# Patient Record
Sex: Female | Born: 1985 | Race: Black or African American | Hispanic: No | Smoking: Former smoker
Health system: Southern US, Community
[De-identification: ages and names within clinical notes are randomized; demographics above are authoritative.]

## PROBLEM LIST (undated history)

## (undated) ENCOUNTER — Inpatient Hospital Stay (HOSPITAL_COMMUNITY): Payer: Self-pay

## (undated) DIAGNOSIS — R51 Headache: Secondary | ICD-10-CM

## (undated) DIAGNOSIS — K219 Gastro-esophageal reflux disease without esophagitis: Secondary | ICD-10-CM

## (undated) DIAGNOSIS — Z9889 Other specified postprocedural states: Secondary | ICD-10-CM

## (undated) DIAGNOSIS — R12 Heartburn: Secondary | ICD-10-CM

## (undated) DIAGNOSIS — D573 Sickle-cell trait: Secondary | ICD-10-CM

## (undated) DIAGNOSIS — O139 Gestational [pregnancy-induced] hypertension without significant proteinuria, unspecified trimester: Secondary | ICD-10-CM

## (undated) DIAGNOSIS — O26899 Other specified pregnancy related conditions, unspecified trimester: Secondary | ICD-10-CM

## (undated) DIAGNOSIS — R112 Nausea with vomiting, unspecified: Secondary | ICD-10-CM

## (undated) DIAGNOSIS — B029 Zoster without complications: Secondary | ICD-10-CM

## (undated) DIAGNOSIS — T7840XA Allergy, unspecified, initial encounter: Secondary | ICD-10-CM

## (undated) HISTORY — DX: Allergy, unspecified, initial encounter: T78.40XA

## (undated) HISTORY — DX: Zoster without complications: B02.9

## (undated) HISTORY — PX: LAPAROSCOPY: SHX197

---

## 2005-10-28 ENCOUNTER — Inpatient Hospital Stay (HOSPITAL_COMMUNITY): Admission: AD | Admit: 2005-10-28 | Discharge: 2005-10-28 | Payer: Self-pay | Admitting: *Deleted

## 2005-11-05 ENCOUNTER — Inpatient Hospital Stay (HOSPITAL_COMMUNITY): Admission: AD | Admit: 2005-11-05 | Discharge: 2005-11-05 | Payer: Self-pay | Admitting: Family Medicine

## 2006-05-20 ENCOUNTER — Ambulatory Visit (HOSPITAL_COMMUNITY): Admission: RE | Admit: 2006-05-20 | Discharge: 2006-05-20 | Payer: Self-pay | Admitting: Obstetrics and Gynecology

## 2006-11-03 ENCOUNTER — Emergency Department (HOSPITAL_COMMUNITY): Admission: EM | Admit: 2006-11-03 | Discharge: 2006-11-04 | Payer: Self-pay | Admitting: Emergency Medicine

## 2006-12-16 ENCOUNTER — Ambulatory Visit: Payer: Self-pay | Admitting: Family Medicine

## 2006-12-16 ENCOUNTER — Inpatient Hospital Stay (HOSPITAL_COMMUNITY): Admission: AD | Admit: 2006-12-16 | Discharge: 2006-12-17 | Payer: Self-pay | Admitting: Sports Medicine

## 2007-04-03 ENCOUNTER — Ambulatory Visit (HOSPITAL_COMMUNITY): Admission: RE | Admit: 2007-04-03 | Discharge: 2007-04-03 | Payer: Self-pay | Admitting: Obstetrics and Gynecology

## 2007-07-17 ENCOUNTER — Emergency Department (HOSPITAL_COMMUNITY): Admission: EM | Admit: 2007-07-17 | Discharge: 2007-07-17 | Payer: Self-pay | Admitting: Emergency Medicine

## 2007-08-04 ENCOUNTER — Emergency Department (HOSPITAL_COMMUNITY): Admission: EM | Admit: 2007-08-04 | Discharge: 2007-08-05 | Payer: Self-pay | Admitting: Emergency Medicine

## 2008-09-10 ENCOUNTER — Emergency Department (HOSPITAL_COMMUNITY): Admission: EM | Admit: 2008-09-10 | Discharge: 2008-09-10 | Payer: Self-pay | Admitting: Emergency Medicine

## 2009-02-08 ENCOUNTER — Emergency Department (HOSPITAL_COMMUNITY): Admission: EM | Admit: 2009-02-08 | Discharge: 2009-02-08 | Payer: Self-pay | Admitting: Emergency Medicine

## 2009-05-04 ENCOUNTER — Ambulatory Visit (HOSPITAL_COMMUNITY): Admission: RE | Admit: 2009-05-04 | Discharge: 2009-05-04 | Payer: Self-pay | Admitting: Obstetrics & Gynecology

## 2009-09-11 ENCOUNTER — Inpatient Hospital Stay (HOSPITAL_COMMUNITY): Admission: AD | Admit: 2009-09-11 | Discharge: 2009-09-11 | Payer: Self-pay | Admitting: Obstetrics & Gynecology

## 2009-11-15 ENCOUNTER — Ambulatory Visit (HOSPITAL_COMMUNITY): Admission: RE | Admit: 2009-11-15 | Discharge: 2009-11-15 | Payer: Self-pay | Admitting: Obstetrics

## 2009-12-06 ENCOUNTER — Ambulatory Visit (HOSPITAL_COMMUNITY): Admission: RE | Admit: 2009-12-06 | Discharge: 2009-12-06 | Payer: Self-pay | Admitting: Obstetrics & Gynecology

## 2009-12-27 ENCOUNTER — Ambulatory Visit (HOSPITAL_COMMUNITY): Admission: RE | Admit: 2009-12-27 | Discharge: 2009-12-27 | Payer: Self-pay | Admitting: Obstetrics & Gynecology

## 2010-02-02 ENCOUNTER — Ambulatory Visit: Payer: Self-pay | Admitting: Nurse Practitioner

## 2010-02-02 ENCOUNTER — Inpatient Hospital Stay (HOSPITAL_COMMUNITY): Admission: AD | Admit: 2010-02-02 | Discharge: 2010-02-02 | Payer: Self-pay | Admitting: Obstetrics

## 2010-02-17 ENCOUNTER — Ambulatory Visit (HOSPITAL_COMMUNITY): Admission: RE | Admit: 2010-02-17 | Discharge: 2010-02-17 | Payer: Self-pay | Admitting: Obstetrics & Gynecology

## 2010-03-15 ENCOUNTER — Encounter (INDEPENDENT_AMBULATORY_CARE_PROVIDER_SITE_OTHER): Payer: Self-pay | Admitting: *Deleted

## 2010-05-12 ENCOUNTER — Ambulatory Visit (HOSPITAL_COMMUNITY): Admission: RE | Admit: 2010-05-12 | Discharge: 2010-05-12 | Payer: Self-pay | Admitting: Obstetrics & Gynecology

## 2010-05-15 ENCOUNTER — Inpatient Hospital Stay (HOSPITAL_COMMUNITY): Admission: AD | Admit: 2010-05-15 | Discharge: 2010-05-20 | Payer: Self-pay | Admitting: Obstetrics & Gynecology

## 2010-05-17 ENCOUNTER — Encounter: Payer: Self-pay | Admitting: Obstetrics & Gynecology

## 2010-08-27 HISTORY — PX: WISDOM TOOTH EXTRACTION: SHX21

## 2010-09-13 IMAGING — US US OB DETAIL+14 WK
1 series · 14 of 28 positions shown · non-contrast
Comparison: none

OBSTETRICAL ULTRASOUND:
 This ultrasound was performed in The [HOSPITAL], and the [facility] report will be stored to [REDACTED] PACS.  This report is also available in [HOSPITAL]?s accessANYware.

[Series 1: us ob detail+14 wk · 14 of 101 slices shown]
[im 4/101]
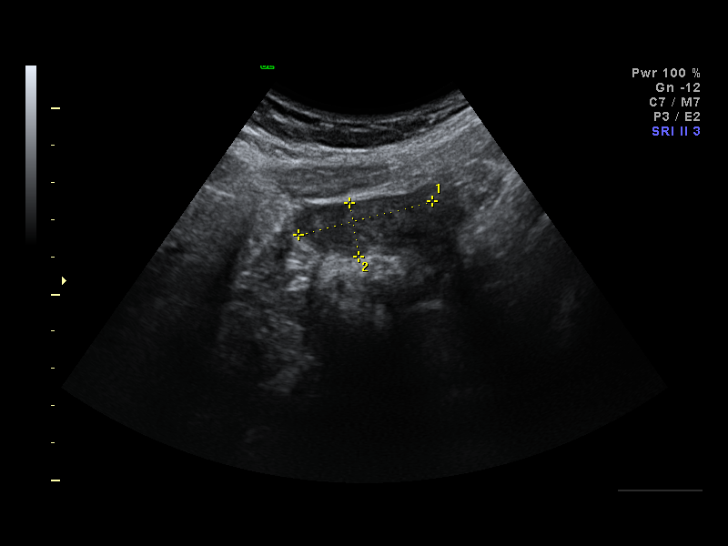
[im 12/101]
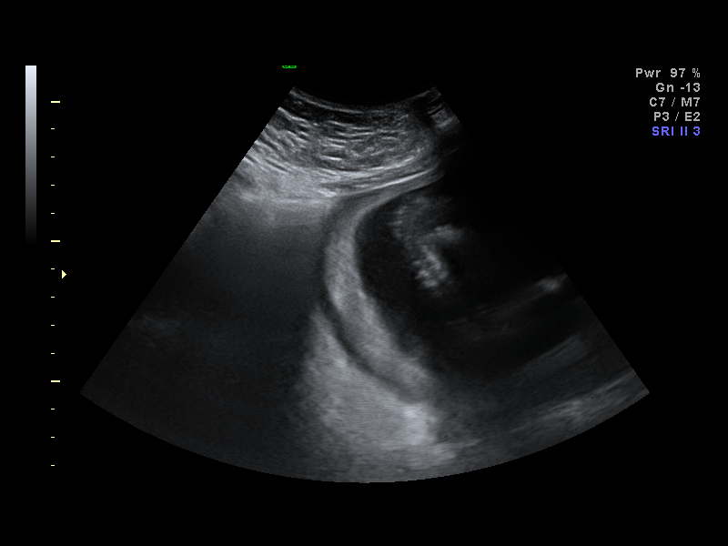
[im 19/101]
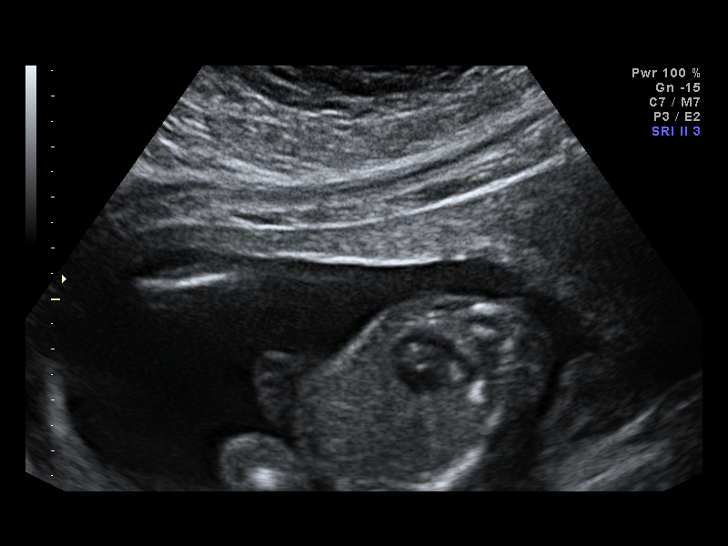
[im 26/101]
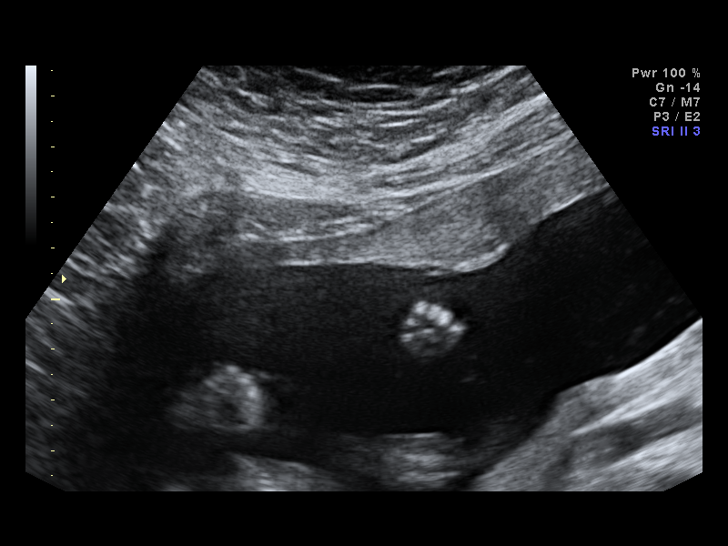
[im 34/101]
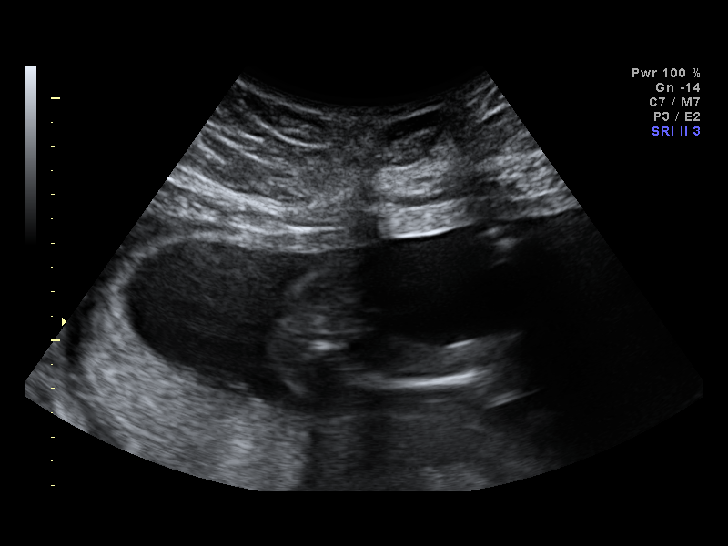
[im 41/101]
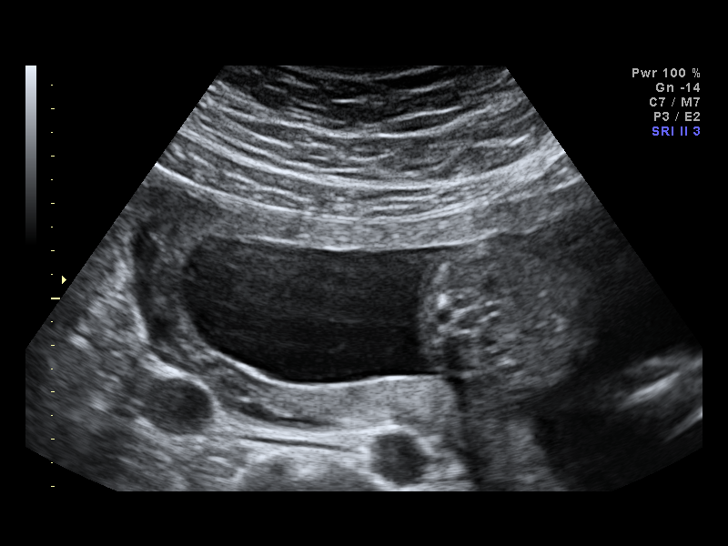
[im 49/101]
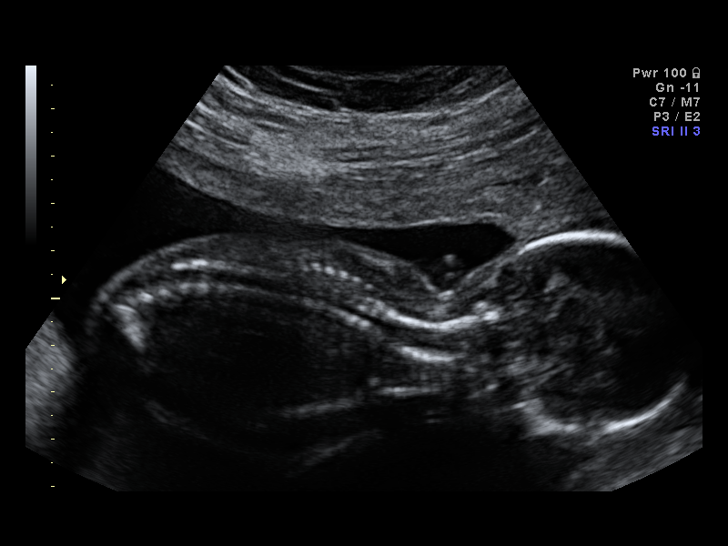
[im 56/101]
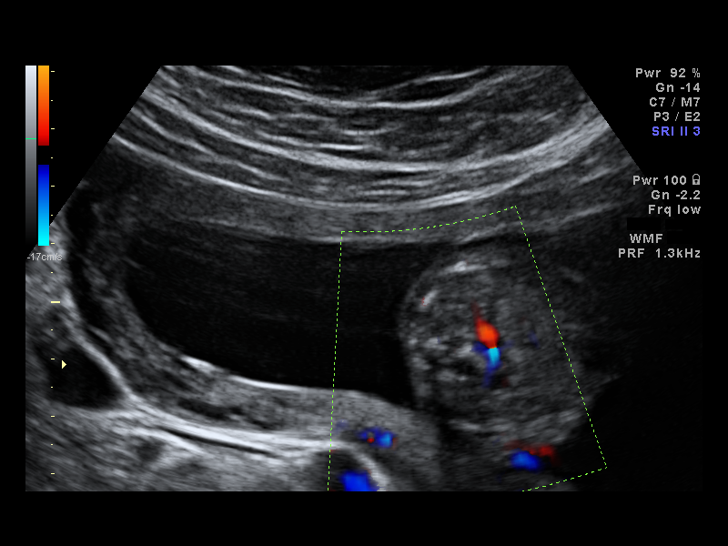
[im 63/101]
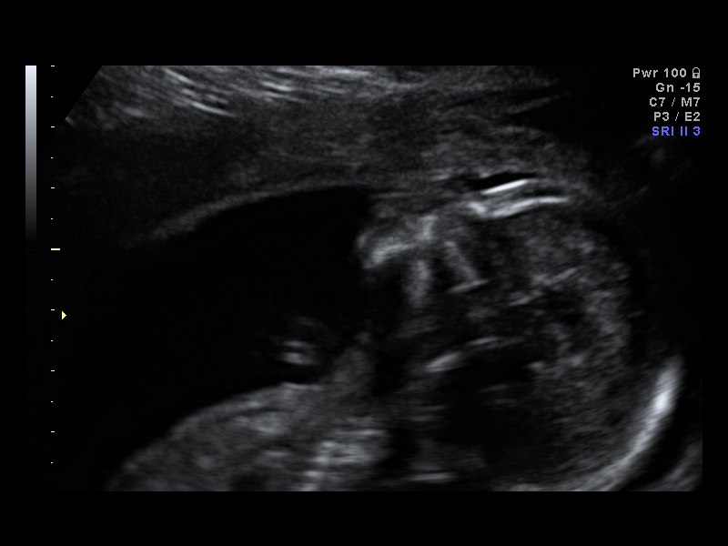
[im 71/101]
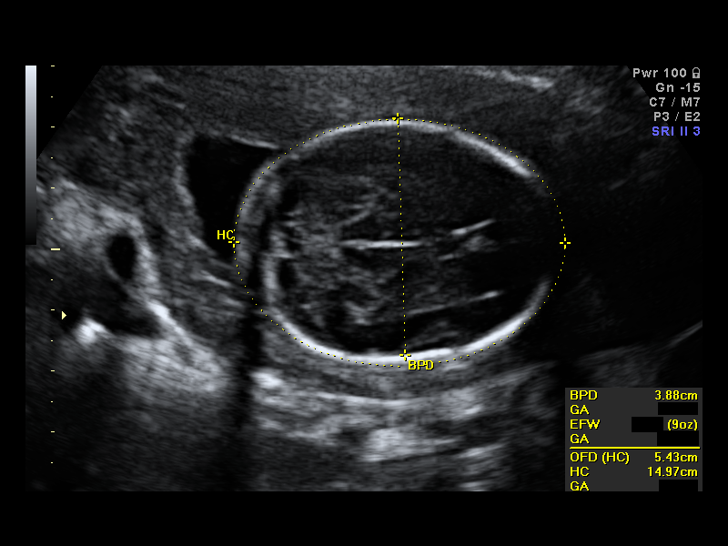
[im 78/101]
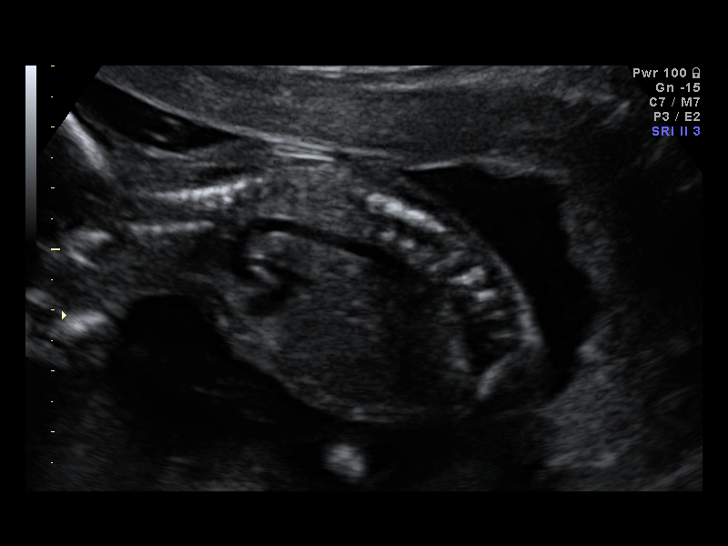
[im 86/101]
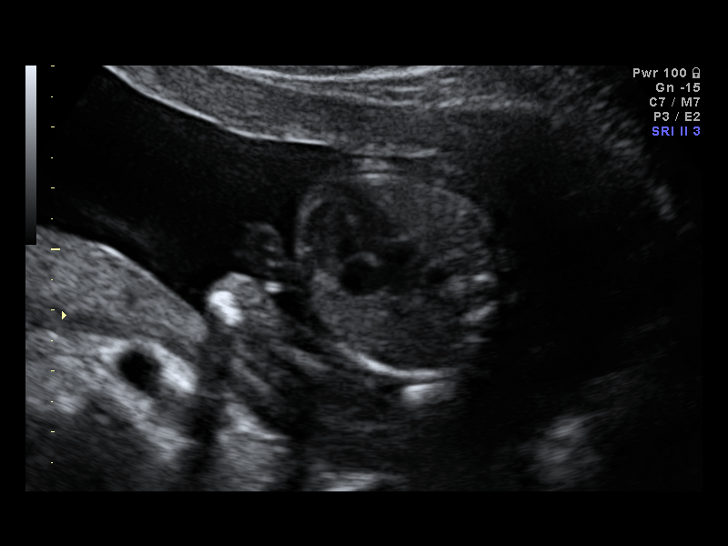
[im 93/101]
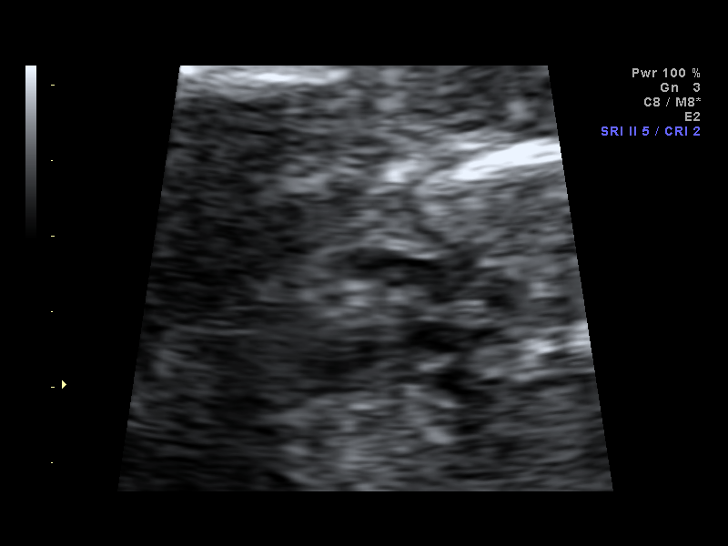
[im 101/101]
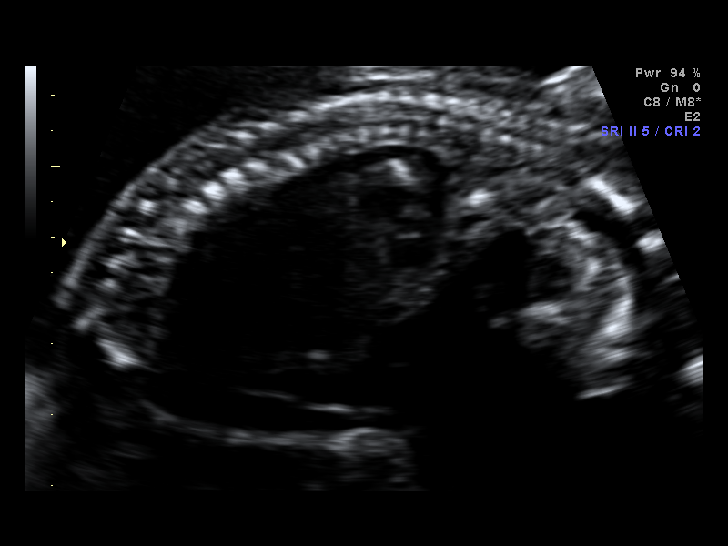

[14 of 28 positions shown; findings below may reference images not displayed]

IMPRESSION: [facility] has also been faxed to the ordering physician.

## 2010-09-26 NOTE — Letter (Signed)
Summary: New Patient letter  [facility]  [address] [place], [place] [address]   Phone: [phone]  Fax: [phone]       03/15/2010 MRN: [id number]  Tammy Jordan [address] [place], [place]  [address]  Dear Tammy Jordan,  Welcome to the Gastroenterology Division at [facility].    You are scheduled to see Dr.  Christella Hartigan on 05-03-10 at 10;00a.m.  on the 3rd floor at [facility], [address]  We ask that you try to arrive at our office 15 minutes prior to your appointment time to allow for check-in.  We would like you to complete the enclosed self-administered evaluation form prior to your visit and bring it with you on the day of your appointment.  We will review it with you.  Also, please bring a complete list of all your medications or, if you prefer, bring the medication bottles and we will list them.  Please bring your insurance card so that we may make a copy of it.  If your insurance requires a referral to see a specialist, please bring your referral form from your primary care physician.  Co-payments are due at the time of your visit and may be paid by cash, check or credit card.     Your office visit will consist of a consult with your physician (includes a physical exam), any laboratory testing he/she may order, scheduling of any necessary diagnostic testing (e.g. x-ray, ultrasound, CT-scan), and scheduling of a procedure (e.g. Endoscopy, Colonoscopy) if required.  Please allow enough time on your schedule to allow for any/all of these possibilities.    If you cannot keep your appointment, please call [phone] to cancel or reschedule prior to your appointment date.  This allows [place] the opportunity to schedule an appointment for another patient in need of care.  If you do not cancel or reschedule by 5 p.m. the business day prior to your appointment date, you will be charged a $50.00 late cancellation/no-show fee.    Thank you for choosing  [facility] for your medical needs.  We appreciate the opportunity to care for you.  Please visit [place] at our website  to learn more about our practice.                     Sincerely,                                                             The Gastroenterology Division

## 2010-11-09 LAB — DIFFERENTIAL
Eosinophils Absolute: 0 10*3/uL (ref 0.0–0.7)
Eosinophils Relative: 0 % (ref 0–5)
Neutro Abs: 9.4 10*3/uL — ABNORMAL HIGH (ref 1.7–7.7)
Neutrophils Relative %: 81 % — ABNORMAL HIGH (ref 43–77)

## 2010-11-09 LAB — LACTATE DEHYDROGENASE: LDH: 179 U/L (ref 94–250)

## 2010-11-09 LAB — COMPREHENSIVE METABOLIC PANEL
AST: 17 U/L (ref 0–37)
Creatinine, Ser: 0.83 mg/dL (ref 0.4–1.2)
GFR calc Af Amer: >60 mL/min (ref >60)
GFR calc non Af Amer: >60 mL/min (ref >60)
Glucose, Bld: 93 mg/dL (ref 70–99)
Total Protein: 6.6 g/dL (ref 6.0–8.3)

## 2010-11-09 LAB — CBC
HCT: 29.1 % — ABNORMAL LOW (ref 36.0–46.0)
HCT: 32.9 % — ABNORMAL LOW (ref 36.0–46.0)
Hemoglobin: 9.7 g/dL — ABNORMAL LOW (ref 12.0–15.0)
MCH: 27.3 pg (ref 26.0–34.0)
MCHC: 33.3 g/dL (ref 30.0–36.0)
MCV: 79.9 fL (ref 78.0–100.0)
MCV: 81.4 fL (ref 78.0–100.0)
Platelets: 211 10*3/uL (ref 150–400)
Platelets: 214 10*3/uL (ref 150–400)
RBC: 3.36 MIL/uL — ABNORMAL LOW (ref 3.87–5.11)
RDW: 14.7 % (ref 11.5–15.5)
WBC: 11.6 10*3/uL — ABNORMAL HIGH (ref 4.0–10.5)
WBC: 9.5 10*3/uL (ref 4.0–10.5)

## 2010-11-09 LAB — RPR: RPR Ser Ql: NONREACTIVE

## 2010-11-09 LAB — URIC ACID: Uric Acid, Serum: 5.9 mg/dL (ref 2.4–7.0)

## 2010-11-12 LAB — URINALYSIS, ROUTINE W REFLEX MICROSCOPIC
Ketones, ur: NEGATIVE mg/dL
Nitrite: NEGATIVE
Specific Gravity, Urine: 1.01 (ref 1.005–1.030)

## 2010-11-13 LAB — URINALYSIS, ROUTINE W REFLEX MICROSCOPIC
Glucose, UA: NEGATIVE mg/dL
Ketones, ur: NEGATIVE mg/dL
Nitrite: NEGATIVE
Protein, ur: NEGATIVE mg/dL
Urobilinogen, UA: 0.2 mg/dL (ref 0.0–1.0)
pH: 5.5 (ref 5.0–8.0)

## 2010-12-04 LAB — POCT I-STAT, CHEM 8
Calcium, Ion: 1.15 mmol/L (ref 1.12–1.32)
Chloride: 103 mEq/L (ref 96–112)
HCT: 41 % (ref 36.0–46.0)
Potassium: 4.3 mEq/L (ref 3.5–5.1)
Sodium: 138 mEq/L (ref 135–145)

## 2010-12-04 LAB — POCT PREGNANCY, URINE: Preg Test, Ur: NEGATIVE

## 2010-12-11 LAB — POCT PREGNANCY, URINE: Preg Test, Ur: NEGATIVE

## 2011-01-09 NOTE — Op Note (Signed)
NAMEDarnelle Jordan                 ACCOUNT NO.:  [id number]   MEDICAL RECORD NO.:  [id number]           PATIENT TYPE:   LOCATION:                                FACILITY:  [facility]   PHYSICIAN:  Lenoard Aden, M.D.DATE OF BIRTH:  11/03/1996   DATE OF PROCEDURE:  04/03/2007  DATE OF DISCHARGE:                               OPERATIVE REPORT   PREOPERATIVE DIAGNOSIS:  Recurrent pelvic pain with history of  endometriosis.   POSTOPERATIVE DIAGNOSIS:  Recurrent pelvic endometriosis.   PROCEDURE:  1. Diagnostic laparoscopy.  2. Fulguration of endometriosis.   SURGEON:  Lenoard Aden, M.D.   ANESTHESIA:  General.   ESTIMATED BLOOD LOSS:  Less than 50 mL.   COMPLICATIONS:  None.   DRAINS:  None.   COUNTS:  Correct.   DISPOSITION:  Patient to Recovery in good condition.   BRIEF OPERATIVE NOTE:  After being apprised of risks of anesthesia,  infection, bleeding, injury to abdominal organs with need for repair,  delayed risks or immediate complications to include bowel and bladder  injury, or inability to cure pelvic pain, the patient was brought to the  operating where she was administered a general anesthetic without  complications and prepped and draped in the usual sterile fashion,  catheterized and Foley catheter placed and Rubens cannula placed per  vagina.  Infraumbilical incision was made with a scalpel and Veress  needle placed, opening pressure of -2 noted, 3 L of CO2 insufflated  without difficulty, trocar placed, atraumatic trocar placement noted,  pictures taken, normal liver and gallbladder bed, normal appendiceal  area, normal-size uterus.  Anterior cul-de-sac is clear.  Findings are  as follows:  There is diffuse pelvic endometriosis along the right and  left uterosacral ligaments, along the posterior cul-de-sac of Douglas  and superficial implants along the right ovary.  Attempts to use YAG  laser are unsuccessful due to malfunction of the device; therefore,  Kleppinger bipolar cautery is placed through the 5-mm port  suprapubically without difficulty and fulguration of multiple implants  are done without difficulty.  Ureters are identified bilaterally and are  not part of the process.  Uterosacral ligaments are bilaterally  fulgurated, exposing multiple endometrial implants which are fulgurated  without difficulty.  Right ovary is fulgurated on the surface using  electrocautery without difficulty.  Good hemostasis is noted.  Bilateral  normal  tubes are noted.  At this time, procedure is terminated.  All  instruments are removed under direct visualization.  CO2 is released.  Incisions are closed using 0 Vicryl, 4-0 Vicryl and Dermabond.  Instruments are removed from the vagina.  The patient tolerates the  procedure well and is transferred to Recovery in good condition.      Lenoard Aden, M.D.  Electronically Signed     RJT/MEDQ  D:  04/03/2007  T:  04/04/2007  Job:  [id number]

## 2011-01-12 NOTE — H&P (Signed)
NAME:  Tammy Jordan, Tammy Jordan NO.:  [id number]   MEDICAL RECORD NO.:  [id number]          PATIENT TYPE:  OBV   LOCATION:  5727                         FACILITY:  [facility]   PHYSICIAN:  Melina Fiddler, MD DATE OF BIRTH:  16-Jun-1986   DATE OF ADMISSION:  12/15/2006  DATE OF DISCHARGE:                              HISTORY & PHYSICAL   PRIMARY CARE PHYSICIAN:  Dr. Merla Riches at [facility].   CHIEF COMPLAINT:  Vomiting and low back pain.   HISTORY OF PRESENT ILLNESS:  The patient is a very pleasant 25 year old  African American female who developed dysuria, frequency, and urgency on  Thursday.  By Friday night and early Saturday morning, it had progressed  to fever, nausea, and vomiting along with right flank pain.  She saw Dr.  Merla Riches at [facility] on April 19.  At that date, a  urinalysis shows positive nitrites, moderate leukocytes, 2+ rods, too  numerous to count white blood cells, negative epithelial cells and CVA  tenderness.  A white blood cell count obtained that day was 9.8.  She  was given IV fluids, Phenergan and ceftriaxone.   On return to [facility] today, white blood cell count is 8.1.  Urinalysis shows negative nitrite, small leukocyte esterase, white blood  cells 7-30 and trace bacteria.  However, her symptoms were not improved.  The patient was still complaining of fever, right CVA tenderness, nausea  and vomiting.  Dr. Merla Riches was particularly concerned about the level  of CVA tenderness that the patient had.  He thought that her pain was  out of proportion to the laboratory findings and was concerned about  possibly a perinephric or renal abscess that was resistant to  antibiotics.   The patient currently denies any vaginal discharge, diarrhea, abdominal  pain, pelvic pain or any other symptoms.   PAST MEDICAL HISTORY:  1. Endometriosis.  2. History of a laparoscopy by Dr. Billy Coast which diagnosed her  endometriosis.   PAST SURGICAL HISTORY:  She has had an exploratory laparotomy by Dr.  Billy Coast.   MEDICATIONS:  1. Depo-Provera every 3 months.  2. P.r.n. OxyContin for her pelvic pain.   ALLERGIES:  To SEAFOOD and BEE STINGS.   SOCIAL HISTORY:  The patient is single.  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  She admits to occasional alcohol,  remote tobacco, no recreational drugs.  She has a stable monogamous  sexual relationship with 1 partner.  She states that she uses condoms  every time in addition to Depo-Provera.  Furthermore, she says she has  no concern about possible STD exposure with this partner and she denies  any vaginal symptoms at the present time.   FAMILY HISTORY:  Mom has a history of throat cancer and hypertension.  Father has a history of diabetes mellitus and hypertension.  Maternal  grandmother had female cancer.   REVIEW OF SYSTEMS:  CONSTITUTIONAL:  Positive for fever, chills and  myalgia.  HEENT:  No rhinorrhea, no sore throat, no otalgia, no blurred  vision.  PULMONARY:  No cough, no shortness of breath, no dyspnea on  exertion, no pleurisy.  CARDIOVASCULAR:  No chest pain, but is positive  for tachycardia.  ABDOMEN:  She reports right flank pain that radiates  to the right groin, positive nausea, positive vomiting, no diarrhea, no  bright red blood per rectum and no melena and no symptoms of a bowel  obstruction.   PHYSICAL EXAM:  VITALS:  Pending at the time of this dictation.  GENERAL:  In no apparent distress, alert and oriented x3.  The patient  is nontoxic-appearing and sleeping comfortably in bed.  You can tell  that the patient does not see well; however, she does not have a septic  appearance.  HEENT:  Atraumatic, normocephalic.  Pupils are equal, round and reactive  to light.  Extraocular movements are intact.  TMs and canals clear on  examination.  There is no erythema or exudate in the posterior  oropharynx.  Mucous  membranes are moist.  NECK:  There is no lymphadenopathy in the neck, No JVD, no bruits and no  thyromegaly.  CARDIOVASCULAR:  Tachycardic at 106 beats per minute with no murmurs,  rubs or gallops.  PULMONARY:  Clear to auscultation bilaterally.  No wheezes, crackles or  rales.  ABDOMEN:  Soft, nontender and nondistended.  Positive bowel sounds.  There is no guarding, no rebound, no peritoneal signs.  There is no  pelvic tenderness, although she does have significant right CVA  tenderness.  EXTREMITIES:  No cyanosis, clubbing, edema or rashes.  NEUROLOGIC:  Cranial nerves II-XII are grossly intact with muscle  strength 5/5, equal and symmetric in the upper and lower extremities.   ASSESSMENT AND PLAN:  [age]-year-old African American female with  presumed pyelonephritis.  1. Pyelonephritis:  I agree with Dr. Merla Riches that the patient needs      to be observed overnight here in the hospital.  We will continue      the Rocephin 1 g intravenously q.24 h., as based on urinalysis at      [facility] it looks like it is an Escherichia coli      and pyelonephritis.  We will give her as-needed Phenergan and      morphine.  We will give her intravenous fluids and advance diet as      tolerated.  We will send blood cultures, urine cultures, urinalysis      and a urine Gram's stain.  We will check a urine pregnancy test.      At the present time, there appears to be no indication for      gonorrhea or Chlamydia testing.  I discussed options with the      patient and her family.  For the present time, we will observe      clinically overnight, as she has only been on antibiotics for less      than 24 hours.  Per my reading an up-to-date, it is usually only      considered antibiotic failure after 3 days if symptoms are      worsening, for which we should scan for the perinephric abscess.     However, if her condition or symptoms worsen, would consider the CT      to look for  the perinephric abscess.  Based on literature, there      appears to be no true cross-reactivity with radiocontrast media and      shellfish iodine; it is usually just an increased  risk of allergic      reaction in atopic individuals.  However, given her relatively      stable clinical appearance, I do not feel it is urgent to do      tonight and therefore I will not risk the possible allergic      interaction unless necessary.  2. Fluids, electrolytes and nutrition:  We will check a CMP and CBC.      Intravenous fluids of normal saline 1-L      bolus then 150 mL an hour thereafter and advance diet as tolerated.      We will follow up her electrolytes and replete any electrolyte      deficiencies, given her recent nausea and vomiting.  We will follow      up her laboratory studies and monitor clinically over the next 24      hours.      Broadus John T. Pamalee Leyden, MD    ______________________________  Melina Fiddler, MD    WTP/MEDQ  D:  12/15/2006  T:  12/16/2006  Job:  [id number]   cc:   Attention:  Dr. Sheryle Spray [facility]

## 2011-01-12 NOTE — Op Note (Signed)
NAME:  Tammy Jordan, Tammy Jordan                 ACCOUNT NO.:  [id number]   MEDICAL RECORD NO.:  [id number]          PATIENT TYPE:  AMB   LOCATION:  SDC                           FACILITY:  [facility]   PHYSICIAN:  Lenoard Aden, M.D.DATE OF BIRTH:  10-01-1985   DATE OF PROCEDURE:  05/21/2006  DATE OF DISCHARGE:                                 OPERATIVE REPORT   PREOPERATIVE DIAGNOSIS:  Pelvic pain.   POSTOPERATIVE DIAGNOSIS:  Pelvic endometriosis.   OPERATION/PROCEDURE:  1. Diagnostic laparoscopy.  2. Ablation of pelvic endometriosis.   SURGEON:  Lenoard Aden, M.D.   ANESTHESIA:  General.   ESTIMATED BLOOD LOSS:  Less than 50 mL.   COMPLICATIONS:  None.   DRAINS:  None.   COUNTS:  Correct.   DISPOSITION:  The patient to the recovery room in good condition.   DESCRIPTION OF PROCEDURE:  After being apprised of the risks of anesthesia,  infection, bleeding, injury to abdominal organs and need for repair, delayed  versus immediate complications to include bowel and bladder injury noted,  and inability to cure pelvic pain, she was brought to the operating room  where she was administered a general anesthesia anesthetic without  complications, prepped and draped in the usual sterile fashion.  Foley  catheter placed.  After achieving adequate anesthesia, a Hulka tenaculum was  placed per vagina.  Infraumbilical incision was made with the scalpel.  Veress needle placed.  Opening pressure minus 2 noted and  3 L CO2  insufflated without difficulty.  Trocar placed for visualization and  laparoscope placed and visualization reveals atraumatic trocar entry.  Normal liver and gallbladder area.  Normal appendiceal area.  Normal size  uterus.  Bilateral normal tubes and ovaries.  There is diffuse punctate  vesicular appearing endometriosis along the right uterosacral, left  uterosacral and enteric cul-de-sac.  There do appear to be some mild  implants along the right ovary.  Kleppinger bipolar  cautery is entered  through a 5 mm suprapubic port under direct visualization, and all of these  diffuse lesions, most of which are surface cauterized using the Kleppinger  bipolar cautery and anterior posterior cul-de-sac, right uterosacral  ligament and there is a Master's window just beneath the left uterosacral  ligament which is ablated.  Having achieved good hemostasis and ablation of  visible endometriosis, except for that which lies in the right ovarian fossa  over the right  ureter, all instruments were removed under direct visualization.  CO2 is  released.  Incision closed with using 0 Vicryl and Dermabond. Foley catheter  and Hulka tenaculum removed from the vagina. Marcaine solution previously  placed.  The patient is awakened and  transferred to recovery in good  condition.      Lenoard Aden, M.D.  Electronically Signed     RJT/MEDQ  D:  05/20/2006  T:  05/22/2006  Job:  [id number]

## 2011-01-12 NOTE — Discharge Summary (Signed)
NAME:  Tammy Jordan, Tammy Jordan NO.:  [id number]   MEDICAL RECORD NO.:  [id number]          PATIENT TYPE:  INP   LOCATION:  5727                         FACILITY:  [facility]   PHYSICIAN:  Melina Fiddler, MD DATE OF BIRTH:  [birth year]   DATE OF ADMISSION:  12/15/2006  DATE OF DISCHARGE:  12/17/2006                               DISCHARGE SUMMARY   Primary care physician is Dr. Merla Riches at [facility].   DISCHARGE DIAGNOSES:  1. Pyelonephritis.  2. Nausea and vomiting.  3. Fever.   DISCHARGE MEDICATIONS:  1. Bactrim Double Strength two tablets p.o. b.i.d. x14 days.  2. Depo-Provera as previously instructed.   PERTINENT LABORATORY DATA:  Initial urinalysis on admission was negative  for nitrite but showed moderate leukocyte esterase; however, the patient  had already had antibiotics prior to this.  Gram stain was negative for  bacteria.  Urinalysis and Gram stain from [facility] were  significantly indicative of infection with gram-negative rods present on  Gram stain.  Urine pregnancy test was negative on admission.  CBC on  admission revealed a white blood cell count of 10.6, hemoglobin of 11.5,  hematocrit of 33.4 and platelets of 217.  Complete metabolic panel  revealed a potassium of 3.4 and an albumin 3.2 but was otherwise  unremarkable.  Blood cultures obtained on admission showed no growth to  date at the time of discharge.  Urine culture obtained on admission here  was no growth final.  Urine culture results sent by [facility]  prior to initiation of antibiotics are pending at the time of discharge  at [facility].  Attempt was made to obtain these results, which did reveal  greater than 100,000 colonies of E. coli; however, sensitivities were  still pending at the time of discharge.  Basic metabolic panel at the  time of discharge revealed normal electrolytes.  CBC at the time of  discharge revealed a white blood cell count of 6.7,  hemoglobin 11.7,  hematocrit 35.0 and platelets of 258.   CONSULTATION:  None.   PROCEDURES:  None.   BRIEF HOSPITAL COURSE:  Please see full dictated history and physical  for full details of initial presentation and workup.  In brief this is a  25 year old African American female admitted with pyelonephritis.  The  patient was sent from Urgent Care after initiation of IV Rocephin given  her persistent fevers and CVA tenderness with concern for possible  development of perinephric abscess.   Problem #1.  PYELONEPHRITIS:  The patient continued to spike nightly  fevers while in the hospital of 101.6 to 101.7.  However, her clinical  picture showed progressive improvement daily while in the hospital.  Her  right CVA tenderness progressively decreased to minimal by the time of  discharge.  The patient had no abdominal pain, and nausea and vomiting  were completely resolved by the time of discharge.  The patient was  tolerating a full diet.  Multiple attempts were made to obtain urine  culture results from [facility] and [facility] prior to discharge.  [facility] was  able to give a verbal result over the phone of greater than  100,000 colonies of E. coli; however, sensitivities were unavailable at  the time of discharge.  Given the patient's clinical improvement and  desire to be discharged, the patient was discharged home on empiric  therapy of p.o. Bactrim after receiving two doses of IV Rocephin.  The  patient was advised to return to Urgent Care within the next 1-2 days  after discharge to confirm that her urine culture positive for E. coli  showed sensitivity to Bactrim.  The patient was also advised to return  for any worsening fevers, increase in the CVA tenderness or abdominal  pain or inability to tolerate p.o. antibiotics.  Given the patient's  progressive clinical improvement throughout admission, decreasing of  fevers and decreasing of pain, no further imaging or concern  a  perinephric abscess was present.   Problem 2.  FLUIDS, ELECTROLYTES, AND NUTRITION/GASTROINTESTINAL:  The  patient had persistent nausea and vomiting on admission; however, this  quickly resolved and the patient was able to tolerate a full diet  without antiemetics prior to discharge.   DISCHARGE INSTRUCTIONS:  1. The patient is to increase activity slowly, has no dietary      restrictions.  2. The patient is to follow up with Urgent Care in 1-2 days to ensure      that her urine culture results show sensitivity to Bactrim, the      antibiotic she was discharged on.  3. The patient is to return to the emergency room should she have any      increasing in fevers, abdominal pain or CVA tenderness or inability      to tolerate oral antibiotics.   FOLLOW-UP APPOINTMENTS:  The patient is to follow up with Dr. Merla Riches  at [facility] during his walk-in hours 1-2 days after discharge.  A phone number was provided.   Pending results and issues to be followed at the time of discharge:  Urine culture sensitivities from [facility] sent on December 14, 2006.   The patient was discharged home with her mother in stable condition.     ______________________________  Drue Dun, M.D.    ______________________________  Melina Fiddler, MD    EE/MEDQ  D:  12/19/2006  T:  12/19/2006  Job:  [id number]   cc:   Dr. Sheryle Spray [facility]

## 2011-06-11 LAB — CBC
Hemoglobin: 12.4
RDW: 13.2
WBC: 5.5

## 2011-08-28 DIAGNOSIS — K219 Gastro-esophageal reflux disease without esophagitis: Secondary | ICD-10-CM

## 2011-08-28 HISTORY — DX: Gastro-esophageal reflux disease without esophagitis: K21.9

## 2011-09-11 LAB — OB RESULTS CONSOLE ABO/RH: RH Type: POSITIVE

## 2011-09-11 LAB — OB RESULTS CONSOLE HEPATITIS B SURFACE ANTIGEN: Hepatitis B Surface Ag: NEGATIVE

## 2011-09-11 LAB — OB RESULTS CONSOLE GC/CHLAMYDIA: Chlamydia: NEGATIVE

## 2011-09-11 LAB — OB RESULTS CONSOLE RPR: RPR: NONREACTIVE

## 2011-10-24 ENCOUNTER — Inpatient Hospital Stay (HOSPITAL_COMMUNITY)
Admission: AD | Admit: 2011-10-24 | Discharge: 2011-10-24 | Disposition: A | Discharge status: Home / Self Care | Payer: Medicaid Other | Source: Ambulatory Visit | Attending: Obstetrics & Gynecology | Admitting: Obstetrics & Gynecology

## 2011-10-24 ENCOUNTER — Encounter (HOSPITAL_COMMUNITY): Payer: Self-pay

## 2011-10-24 DIAGNOSIS — R109 Unspecified abdominal pain: Secondary | ICD-10-CM | POA: Insufficient documentation

## 2011-10-24 DIAGNOSIS — O99891 Other specified diseases and conditions complicating pregnancy: Secondary | ICD-10-CM | POA: Insufficient documentation

## 2011-10-24 DIAGNOSIS — O219 Vomiting of pregnancy, unspecified: Secondary | ICD-10-CM

## 2011-10-24 DIAGNOSIS — O26899 Other specified pregnancy related conditions, unspecified trimester: Secondary | ICD-10-CM

## 2011-10-24 DIAGNOSIS — O210 Mild hyperemesis gravidarum: Secondary | ICD-10-CM | POA: Insufficient documentation

## 2011-10-24 HISTORY — DX: Sickle-cell trait: D57.3

## 2011-10-24 HISTORY — DX: Gestational (pregnancy-induced) hypertension without significant proteinuria, unspecified trimester: O13.9

## 2011-10-24 LAB — COMPREHENSIVE METABOLIC PANEL
Alkaline Phosphatase: 64 U/L (ref 39–117)
BUN: 7 mg/dL (ref 6–23)
Calcium: 9.3 mg/dL (ref 8.4–10.5)
GFR calc Af Amer: >90 mL/min (ref >90)
Glucose, Bld: 89 mg/dL (ref 70–99)
Total Protein: 7 g/dL (ref 6.0–8.3)

## 2011-10-24 LAB — CBC
Hemoglobin: 11.7 g/dL — ABNORMAL LOW (ref 12.0–15.0)
MCH: 28 pg (ref 26.0–34.0)
MCV: 80.6 fL (ref 78.0–100.0)
RBC: 4.18 MIL/uL (ref 3.87–5.11)

## 2011-10-24 LAB — URINALYSIS, ROUTINE W REFLEX MICROSCOPIC
Glucose, UA: NEGATIVE mg/dL
Leukocytes, UA: NEGATIVE
Protein, ur: NEGATIVE mg/dL
Urobilinogen, UA: 0.2 mg/dL (ref 0.0–1.0)

## 2011-10-24 LAB — DIFFERENTIAL
Eosinophils Absolute: 0.1 10*3/uL (ref 0.0–0.7)
Eosinophils Relative: 1 % (ref 0–5)
Lymphs Abs: 1.9 10*3/uL (ref 0.7–4.0)
Monocytes Relative: 6 % (ref 3–12)

## 2011-10-24 LAB — WET PREP, GENITAL: Trich, Wet Prep: NONE SEEN

## 2011-10-24 MED ORDER — ACETAMINOPHEN 325 MG PO TABS
650.0000 mg | ORAL_TABLET | Freq: Once | ORAL | Status: AC
  Administered 2011-10-24: 650 mg via ORAL
  Filled 2011-10-24: qty 2

## 2011-10-24 MED ORDER — ONDANSETRON HCL 8 MG PO TABS: 8.0000 mg | ORAL_TABLET | Freq: Three times a day (TID) | ORAL | Status: AC | PRN

## 2011-10-24 MED ORDER — ONDANSETRON 8 MG PO TBDP
8.0000 mg | ORAL_TABLET | Freq: Once | ORAL | Status: AC
  Administered 2011-10-24: 8 mg via ORAL
  Filled 2011-10-24: qty 1

## 2011-10-24 NOTE — ED Provider Notes (Signed)
History     CSN: [id number]  Arrival date & time 10/24/11  1219   None     Chief Complaint  Patient presents with  . Abdominal Pain    HPI Tammy Jordan is a 26 y.o. female @ [redacted]w[redacted]d gestation who presents to [facility] for abdominal pain. Has had pressure off and on for  about a month  but was only mild and not painful. Today began having pain with nauea and vomiting. No diarrhea. The history was provided by the patient.   Past Medical History  Diagnosis Date  . Endometriosis   . Sickle cell trait   . Pregnancy induced hypertension     PIH with 1st baby    Past Surgical History  Procedure Date  . Cesarean section   . Laparoscopy   . Wisdom tooth extraction 2012    History reviewed. No pertinent family history.  History  Substance Use Topics  . Smoking status: Never Smoker   . Smokeless tobacco: Not on file  . Alcohol Use: No    OB History    Grav Para Term Preterm Abortions TAB SAB Ect Mult Living   4 1 1  2  2   1       Review of Systems  Constitutional: Positive for chills. Negative for fever, diaphoresis and fatigue.  HENT: Positive for congestion. Negative for ear pain, sore throat, facial swelling, neck pain, neck stiffness, dental problem and sinus pressure.   Eyes: Negative for photophobia, pain and discharge.  Respiratory: Negative for cough, chest tightness and wheezing.   Cardiovascular: Negative.   Gastrointestinal: Positive for nausea, vomiting and abdominal pain. Negative for diarrhea, constipation and abdominal distention.  Genitourinary: Positive for pelvic pain. Negative for dysuria, urgency, frequency, flank pain, vaginal bleeding, vaginal discharge and difficulty urinating.  Musculoskeletal: Positive for back pain. Negative for myalgias and gait problem.  Skin: Negative for color change and rash.  Neurological: Positive for dizziness and headaches. Negative for speech difficulty, weakness, light-headedness and numbness.  Psychiatric/Behavioral:  Negative for confusion and agitation. The patient is not nervous/anxious.     Allergies  Shellfish allergy  Home Medications  No current outpatient prescriptions on file.  BP 113/82  Pulse 94  Temp(Src) 97.9 F (36.6 C) (Oral)  Resp 16  Ht 5' 2.5" (1.588 m)  Wt 171 lb 12.8 oz (77.928 kg)  BMI 30.92 kg/m2  SpO2 100%  Physical Exam  Nursing note and vitals reviewed. Constitutional: She is oriented to person, place, and time. She appears well-developed and well-nourished.  HENT:  Head: Normocephalic.  Eyes: EOM are normal.  Neck: Neck supple.  Cardiovascular: Normal rate.   Pulmonary/Chest: Effort normal.  Abdominal: Soft. There is generalized tenderness. There is no rigidity, no rebound, no guarding and no CVA tenderness.  Genitourinary:       External genitalia without lesions. Cervix long, closed, uterus consistent with dates. Positive FHT.  Musculoskeletal: Normal range of motion.  Neurological: She is alert and oriented to person, place, and time. No cranial nerve deficit.  Skin: Skin is warm and dry.  Psychiatric: She has a normal mood and affect. Her behavior is normal. Judgment and thought content normal.   Results for orders placed during the hospital encounter of 10/24/11 (from the past 24 hour(s))  CBC     Status: Abnormal   Collection Time   10/24/11  2:45 PM      Component Value Range   WBC 9.1  4.0 - 10.5 (K/uL)   RBC  4.18  3.87 - 5.11 (MIL/uL)   Hemoglobin 11.7 (*) 12.0 - 15.0 (g/dL)   HCT 51.8 (*) 84.1 - 46.0 (%)   MCV 80.6  78.0 - 100.0 (fL)   MCH 28.0  26.0 - 34.0 (pg)   MCHC 34.7  30.0 - 36.0 (g/dL)   RDW 66.0  63.0 - 16.0 (%)   Platelets 255  150 - 400 (K/uL)  DIFFERENTIAL     Status: Normal   Collection Time   10/24/11  2:45 PM      Component Value Range   Neutrophils Relative 72  43 - 77 (%)   Neutro Abs 6.5  1.7 - 7.7 (K/uL)   Lymphocytes Relative 21  12 - 46 (%)   Lymphs Abs 1.9  0.7 - 4.0 (K/uL)   Monocytes Relative 6  3 - 12 (%)    Monocytes Absolute 0.6  0.1 - 1.0 (K/uL)   Eosinophils Relative 1  0 - 5 (%)   Eosinophils Absolute 0.1  0.0 - 0.7 (K/uL)   Basophils Relative 0  0 - 1 (%)   Basophils Absolute 0.0  0.0 - 0.1 (K/uL)  COMPREHENSIVE METABOLIC PANEL     Status: Abnormal   Collection Time   10/24/11  2:45 PM      Component Value Range   Sodium 133 (*) 135 - 145 (mEq/L)   Potassium 4.0  3.5 - 5.1 (mEq/L)   Chloride 99  96 - 112 (mEq/L)   CO2 25  19 - 32 (mEq/L)   Glucose, Bld 89  70 - 99 (mg/dL)   BUN 7  6 - 23 (mg/dL)   Creatinine, Ser 1.09  0.50 - 1.10 (mg/dL)   Calcium 9.3  8.4 - 32.3 (mg/dL)   Total Protein 7.0  6.0 - 8.3 (g/dL)   Albumin 3.2 (*) 3.5 - 5.2 (g/dL)   AST 11  0 - 37 (U/L)   ALT 9  0 - 35 (U/L)   Alkaline Phosphatase 64  39 - 117 (U/L)   Total Bilirubin 0.3  0.3 - 1.2 (mg/dL)   GFR calc non Af Amer >90  >90 (mL/min)   GFR calc Af Amer >90  >90 (mL/min)  WET PREP, GENITAL     Status: Abnormal   Collection Time   10/24/11  3:27 PM      Component Value Range   Yeast Wet Prep HPF POC NONE SEEN  NONE SEEN    Trich, Wet Prep NONE SEEN  NONE SEEN    Clue Cells Wet Prep HPF POC NONE SEEN  NONE SEEN    WBC, Wet Prep HPF POC MODERATE (*) NONE SEEN   URINALYSIS, ROUTINE W REFLEX MICROSCOPIC     Status: Abnormal   Collection Time   10/24/11  3:30 PM      Component Value Range   Color, Urine YELLOW  YELLOW    APPearance CLEAR  CLEAR    Specific Gravity, Urine 1.010  1.005 - 1.030    pH 6.0  5.0 - 8.0    Glucose, UA NEGATIVE  NEGATIVE (mg/dL)   Hgb urine dipstick NEGATIVE  NEGATIVE    Bilirubin Urine NEGATIVE  NEGATIVE    Ketones, ur 15 (*) NEGATIVE (mg/dL)   Protein, ur NEGATIVE  NEGATIVE (mg/dL)   Urobilinogen, UA 0.2  0.0 - 1.0 (mg/dL)   Nitrite NEGATIVE  NEGATIVE    Leukocytes, UA NEGATIVE  NEGATIVE    ED Course: Discussed with Dr. Tamela Oddi and will give patient zofran for nausea  and have her follow up in the office. If her pain increases she will return here.   Procedures     MDM: Patient has had no nausea or vomiting since arrival to [facility]   Assessment: Nausea and vomiting in pregnancy   Abdominal pain in pregnancy  Plan:  Zofran Rx   Tylenol prn   Clear liquids today then advance to B.R.A.T. Diet   Follow up in the office or return here if symptoms worsen      [facility], NP 10/24/11 1605

## 2011-10-24 NOTE — Progress Notes (Signed)
Patient states she has had painless lower abdominal pressure for about one month, is getting worse and is now painful. Has had multiple episodes of vomiting today, unable to keep anything down. No bleeding or discharge.

## 2011-10-24 NOTE — Discharge Instructions (Signed)
STAY ON CLEAR LIQUIDS TODAY AND THEN ADVANCE TO THE B.R.A.T. DIET. TAKE THE MEDICATION AS DIRECTED FOR NAUSEA. TAKE TYLENOL FOR PAIN.  I SPOKE WITH DR. Tamela Oddi AND SHE WANTS YOU TO FOLLOW UP IN THE OFFICE. IF YOUR PAIN INCREASES TONIGHT RETURN HERE.

## 2011-10-25 LAB — GC/CHLAMYDIA PROBE AMP, GENITAL: GC Probe Amp, Genital: NEGATIVE

## 2012-01-29 ENCOUNTER — Other Ambulatory Visit: Payer: Self-pay | Admitting: Obstetrics & Gynecology

## 2012-01-29 DIAGNOSIS — Z3689 Encounter for other specified antenatal screening: Secondary | ICD-10-CM

## 2012-02-04 ENCOUNTER — Ambulatory Visit (HOSPITAL_COMMUNITY)
Admission: RE | Admit: 2012-02-04 | Discharge: 2012-02-04 | Disposition: A | Discharge status: Home / Self Care | Payer: Medicaid Other | Source: Ambulatory Visit | Attending: Obstetrics & Gynecology | Admitting: Obstetrics & Gynecology

## 2012-02-04 DIAGNOSIS — O139 Gestational [pregnancy-induced] hypertension without significant proteinuria, unspecified trimester: Secondary | ICD-10-CM | POA: Insufficient documentation

## 2012-02-04 DIAGNOSIS — Z3689 Encounter for other specified antenatal screening: Secondary | ICD-10-CM

## 2012-02-04 DIAGNOSIS — O358XX0 Maternal care for other (suspected) fetal abnormality and damage, not applicable or unspecified: Secondary | ICD-10-CM | POA: Insufficient documentation

## 2012-02-04 DIAGNOSIS — Z363 Encounter for antenatal screening for malformations: Secondary | ICD-10-CM | POA: Insufficient documentation

## 2012-02-04 DIAGNOSIS — O09299 Supervision of pregnancy with other poor reproductive or obstetric history, unspecified trimester: Secondary | ICD-10-CM | POA: Insufficient documentation

## 2012-02-04 DIAGNOSIS — Z1389 Encounter for screening for other disorder: Secondary | ICD-10-CM | POA: Insufficient documentation

## 2012-02-04 DIAGNOSIS — O2620 Pregnancy care for patient with recurrent pregnancy loss, unspecified trimester: Secondary | ICD-10-CM | POA: Insufficient documentation

## 2012-03-04 ENCOUNTER — Other Ambulatory Visit: Payer: Self-pay | Admitting: Obstetrics & Gynecology

## 2012-03-14 ENCOUNTER — Encounter (HOSPITAL_COMMUNITY): Payer: Self-pay

## 2012-03-17 ENCOUNTER — Ambulatory Visit (HOSPITAL_COMMUNITY)
Admission: RE | Admit: 2012-03-17 | Discharge: 2012-03-17 | Disposition: A | Discharge status: Home / Self Care | Payer: Medicaid Other | Source: Ambulatory Visit | Attending: Nurse Practitioner | Admitting: Nurse Practitioner

## 2012-03-17 ENCOUNTER — Other Ambulatory Visit: Payer: Self-pay | Admitting: Nurse Practitioner

## 2012-03-17 DIAGNOSIS — D573 Sickle-cell trait: Secondary | ICD-10-CM | POA: Insufficient documentation

## 2012-03-17 DIAGNOSIS — O99019 Anemia complicating pregnancy, unspecified trimester: Secondary | ICD-10-CM | POA: Insufficient documentation

## 2012-03-17 DIAGNOSIS — O139 Gestational [pregnancy-induced] hypertension without significant proteinuria, unspecified trimester: Secondary | ICD-10-CM | POA: Insufficient documentation

## 2012-03-17 DIAGNOSIS — O368390 Maternal care for abnormalities of the fetal heart rate or rhythm, unspecified trimester, not applicable or unspecified: Secondary | ICD-10-CM | POA: Insufficient documentation

## 2012-03-17 DIAGNOSIS — O288 Other abnormal findings on antenatal screening of mother: Secondary | ICD-10-CM

## 2012-03-17 DIAGNOSIS — I10 Essential (primary) hypertension: Secondary | ICD-10-CM

## 2012-03-17 DIAGNOSIS — O09299 Supervision of pregnancy with other poor reproductive or obstetric history, unspecified trimester: Secondary | ICD-10-CM | POA: Insufficient documentation

## 2012-03-21 ENCOUNTER — Encounter (HOSPITAL_COMMUNITY): Payer: Self-pay

## 2012-03-21 ENCOUNTER — Encounter (HOSPITAL_COMMUNITY)
Admission: RE | Admit: 2012-03-21 | Discharge: 2012-03-21 | Disposition: A | Discharge status: Home / Self Care | Payer: Medicaid Other | Source: Ambulatory Visit | Attending: Obstetrics & Gynecology | Admitting: Obstetrics & Gynecology

## 2012-03-21 HISTORY — DX: Heartburn: R12

## 2012-03-21 HISTORY — DX: Other specified postprocedural states: Z98.890

## 2012-03-21 HISTORY — DX: Other specified pregnancy related conditions, unspecified trimester: O26.899

## 2012-03-21 HISTORY — DX: Nausea with vomiting, unspecified: R11.2

## 2012-03-21 HISTORY — DX: Headache: R51

## 2012-03-21 HISTORY — DX: Gastro-esophageal reflux disease without esophagitis: K21.9

## 2012-03-21 LAB — ABO/RH: ABO/RH(D): O POS

## 2012-03-21 LAB — CBC
MCH: 22.7 pg — ABNORMAL LOW (ref 26.0–34.0)
Platelets: 242 10*3/uL (ref 150–400)
RBC: 4.36 MIL/uL (ref 3.87–5.11)
RDW: 15.3 % (ref 11.5–15.5)
WBC: 9.7 10*3/uL (ref 4.0–10.5)

## 2012-03-21 LAB — TYPE AND SCREEN: ABO/RH(D): O POS

## 2012-03-21 LAB — SURGICAL PCR SCREEN: MRSA, PCR: NEGATIVE

## 2012-03-21 NOTE — Patient Instructions (Addendum)
Your procedure is scheduled on:03/28/12  Enter through the Main Entrance at : 1245pm Pick up desk phone and dial [phone] and inform us of your arrival.  Please call [phone] if you have any problems the morning of surgery.  Remember: Do not eat after 5:30am on Friday Do not drink after:10am on Friday (clear liquids) Milk ok until 8am on Friday  Take these meds the morning of surgery with a sip of water:Pantoprazole, Pepcid  DO NOT wear jewelry, eye make-up, lotion, or dark fingernail polish. Do not shave for 48 hours prior to surgery.  If you are to be admitted after surgery, leave suitcase in car until your room has been assigned. Patients discharged on the day of surgery will not be allowed to drive home.   Remember to use your Hibiclens as instructed.

## 2012-03-21 NOTE — Pre-Procedure Instructions (Signed)
Dr. Rodman Pickle approved pt to drink milk until 6 hrs preop, but wants pt to be aware that surgery would not be able to be moved up to an earlier time.

## 2012-03-22 LAB — RPR: RPR Ser Ql: NONREACTIVE

## 2012-03-27 NOTE — H&P (Signed)
Tammy Jordan is a 26 y.o. female presenting for an elective repeat C/D. Maternal Medical History:  Reason for admission: Presents for an elective repeat C/D.  Fetal activity: Perceived fetal activity is normal.    Prenatal complications: no prenatal complications   OB History    Grav Para Term Preterm Abortions TAB SAB Ect Mult Living   4 1 1  2  2   1      Past Medical History  Diagnosis Date  . Endometriosis   . Sickle cell trait   . Heartburn in pregnancy   . GERD (gastroesophageal reflux disease) 2013    severe (per pt) heartburn- on meds but milk works better  . PONV (postoperative nausea and vomiting)   . Headache     migraines  . Pregnancy induced hypertension     PIH with 1st baby   Past Surgical History  Procedure Date  . Cesarean section   . Laparoscopy   . Wisdom tooth extraction 2012   Family History: family history is not on file. Social History:  reports that she has never smoked. She does not have any smokeless tobacco history on file. She reports that she does not drink alcohol or use illicit drugs.   Prenatal Transfer Tool  Maternal Diabetes: No Maternal Ultrasounds/Referrals: Normal Fetal Ultrasounds or other Referrals:  None Maternal Substance Abuse:  No Significant Maternal Medications:  None Significant Maternal Lab Results:  None Other Comments:  None  Review of Systems  Constitutional: Negative for fever.  Eyes: Negative for blurred vision.  Respiratory: Negative for shortness of breath.   Gastrointestinal: Negative for vomiting.  Skin: Negative for rash.  Neurological: Negative for headaches.      Last menstrual period 06/27/2011. Maternal Exam:  Abdomen: Patient reports no abdominal tenderness. Fetal presentation: vertex  Introitus: not evaluated.   Cervix: not evaluated.   Fetal Exam Fetal Monitor Review: Mode: hand-held doppler probe.       Physical Exam  Constitutional: She appears well-developed.  HENT:  Head:  Normocephalic.  Neck: Neck supple. No thyromegaly present.  Cardiovascular: Normal rate and regular rhythm.   Respiratory: Breath sounds normal.  GI: Soft. Bowel sounds are normal.  Skin: No rash noted.    Prenatal labs: ABO, Rh: --/--/O POS (07/26 1230) Antibody: NEG (07/26 1225) Rubella:   RPR: NON REACTIVE (07/26 1225)  HBsAg: Negative (01/15 0000)  HIV: Non-reactive (01/15 0000)       Assessment/Plan: 26 y.o. P1 w/an IUP @ [redacted]w[redacted]d.  H/O previous C/D.  Declines TOLAC.  Admit Repeat C/D   JACKSON-MOORE,Marthann Abshier A 03/27/2012, 2:58 PM

## 2012-03-28 ENCOUNTER — Encounter (HOSPITAL_COMMUNITY)
Admission: RE | Disposition: A | Discharge status: Home / Self Care | Payer: Self-pay | Source: Ambulatory Visit | Attending: Obstetrics & Gynecology

## 2012-03-28 ENCOUNTER — Inpatient Hospital Stay (HOSPITAL_COMMUNITY): Payer: Medicaid Other

## 2012-03-28 ENCOUNTER — Encounter (HOSPITAL_COMMUNITY): Payer: Self-pay

## 2012-03-28 ENCOUNTER — Encounter (HOSPITAL_COMMUNITY): Payer: Self-pay | Admitting: Obstetrics & Gynecology

## 2012-03-28 ENCOUNTER — Inpatient Hospital Stay (HOSPITAL_COMMUNITY)
Admission: RE | Admit: 2012-03-28 | Discharge: 2012-03-31 | DRG: 766 | Disposition: A | Discharge status: Home / Self Care | Payer: Medicaid Other | Source: Ambulatory Visit | Attending: Obstetrics & Gynecology | Admitting: Obstetrics & Gynecology

## 2012-03-28 DIAGNOSIS — IMO0002 Reserved for concepts with insufficient information to code with codable children: Secondary | ICD-10-CM | POA: Diagnosis present

## 2012-03-28 DIAGNOSIS — O34219 Maternal care for unspecified type scar from previous cesarean delivery: Principal | ICD-10-CM | POA: Diagnosis present

## 2012-03-28 LAB — TYPE AND SCREEN: Antibody Screen: NEGATIVE

## 2012-03-28 SURGERY — Surgical Case
Anesthesia: Spinal | Site: Abdomen | Wound class: Clean Contaminated

## 2012-03-28 MED ORDER — MORPHINE SULFATE (PF) 0.5 MG/ML IJ SOLN
INTRAMUSCULAR | Status: DC | PRN
  Administered 2012-03-28: .1 mg via INTRATHECAL

## 2012-03-28 MED ORDER — NALBUPHINE HCL 10 MG/ML IJ SOLN
5.0000 mg | INTRAMUSCULAR | Status: DC | PRN
Start: 2012-03-28 — End: 2012-03-31
  Administered 2012-03-29: 10 mg via SUBCUTANEOUS
  Filled 2012-03-28 (×2): qty 1

## 2012-03-28 MED ORDER — DIPHENHYDRAMINE HCL 25 MG PO CAPS
25.0000 mg | ORAL_CAPSULE | ORAL | Status: DC | PRN
  Administered 2012-03-29 – 2012-03-30 (×2): 25 mg via ORAL
  Filled 2012-03-28 (×3): qty 1

## 2012-03-28 MED ORDER — LANOLIN HYDROUS EX OINT: 1.0000 "application " | TOPICAL_OINTMENT | CUTANEOUS | Status: DC | PRN

## 2012-03-28 MED ORDER — FENTANYL CITRATE 0.05 MG/ML IJ SOLN
25.0000 ug | INTRAMUSCULAR | Status: DC | PRN
  Administered 2012-03-28: 50 ug via INTRAVENOUS

## 2012-03-28 MED ORDER — FENTANYL CITRATE 0.05 MG/ML IJ SOLN
INTRAMUSCULAR | Status: DC | PRN
  Administered 2012-03-28: 25 ug via INTRATHECAL

## 2012-03-28 MED ORDER — OXYTOCIN 10 UNIT/ML IJ SOLN
40.0000 [IU] | INTRAVENOUS | Status: DC | PRN
  Administered 2012-03-28: 40 [IU] via INTRAVENOUS

## 2012-03-28 MED ORDER — OXYCODONE-ACETAMINOPHEN 5-325 MG PO TABS: 1.0000 | ORAL_TABLET | ORAL | Status: DC | PRN

## 2012-03-28 MED ORDER — FENTANYL CITRATE 0.05 MG/ML IJ SOLN
INTRAMUSCULAR | Status: AC
  Administered 2012-03-28: 50 ug via INTRAVENOUS
  Filled 2012-03-28: qty 2

## 2012-03-28 MED ORDER — MIDAZOLAM HCL 2 MG/2ML IJ SOLN: 0.5000 mg | Freq: Once | INTRAMUSCULAR | Status: DC | PRN

## 2012-03-28 MED ORDER — ACETAMINOPHEN 10 MG/ML IV SOLN: 1000.0000 mg | Freq: Four times a day (QID) | INTRAVENOUS | Status: AC | PRN

## 2012-03-28 MED ORDER — SENNOSIDES-DOCUSATE SODIUM 8.6-50 MG PO TABS
2.0000 | ORAL_TABLET | Freq: Every day | ORAL | Status: DC
  Administered 2012-03-29 – 2012-03-30 (×2): 2 via ORAL

## 2012-03-28 MED ORDER — SODIUM CHLORIDE 0.9 % IV SOLN: 1.0000 ug/kg/h | INTRAVENOUS | Status: DC | PRN

## 2012-03-28 MED ORDER — SIMETHICONE 80 MG PO CHEW: 80.0000 mg | CHEWABLE_TABLET | ORAL | Status: DC | PRN

## 2012-03-28 MED ORDER — METOCLOPRAMIDE HCL 5 MG/ML IJ SOLN
INTRAMUSCULAR | Status: AC
  Administered 2012-03-28: 10 mg via INTRAVENOUS
  Filled 2012-03-28: qty 2

## 2012-03-28 MED ORDER — MAGNESIUM HYDROXIDE 400 MG/5ML PO SUSP: 30.0000 mL | ORAL | Status: DC | PRN

## 2012-03-28 MED ORDER — OXYTOCIN 40 UNITS IN LACTATED RINGERS INFUSION - SIMPLE MED: 62.5000 mL/h | INTRAVENOUS | Status: AC

## 2012-03-28 MED ORDER — METOCLOPRAMIDE HCL 5 MG/ML IJ SOLN
10.0000 mg | Freq: Three times a day (TID) | INTRAMUSCULAR | Status: DC | PRN
  Administered 2012-03-28: 10 mg via INTRAVENOUS

## 2012-03-28 MED ORDER — NALBUPHINE HCL 10 MG/ML IJ SOLN
5.0000 mg | INTRAMUSCULAR | Status: DC | PRN
  Administered 2012-03-28 (×2): 10 mg via INTRAVENOUS

## 2012-03-28 MED ORDER — MORPHINE SULFATE 0.5 MG/ML IJ SOLN
INTRAMUSCULAR | Status: AC
  Filled 2012-03-28: qty 10

## 2012-03-28 MED ORDER — KETOROLAC TROMETHAMINE 30 MG/ML IJ SOLN
INTRAMUSCULAR | Status: AC
  Administered 2012-03-28: 30 mg via INTRAVENOUS
  Filled 2012-03-28: qty 1

## 2012-03-28 MED ORDER — FENTANYL CITRATE 0.05 MG/ML IJ SOLN
INTRAMUSCULAR | Status: AC
  Filled 2012-03-28: qty 2

## 2012-03-28 MED ORDER — MEPERIDINE HCL 25 MG/ML IJ SOLN: 6.2500 mg | INTRAMUSCULAR | Status: DC | PRN

## 2012-03-28 MED ORDER — FERROUS SULFATE 325 (65 FE) MG PO TABS
325.0000 mg | ORAL_TABLET | Freq: Two times a day (BID) | ORAL | Status: DC
  Administered 2012-03-29: 325 mg via ORAL
  Administered 2012-03-29: 650 mg via ORAL
  Administered 2012-03-30 – 2012-03-31 (×3): 325 mg via ORAL
  Filled 2012-03-28 (×5): qty 1

## 2012-03-28 MED ORDER — WITCH HAZEL-GLYCERIN EX PADS: 1.0000 "application " | MEDICATED_PAD | CUTANEOUS | Status: DC | PRN

## 2012-03-28 MED ORDER — DIPHENHYDRAMINE HCL 50 MG/ML IJ SOLN: 12.5000 mg | INTRAMUSCULAR | Status: DC | PRN

## 2012-03-28 MED ORDER — BUPIVACAINE IN DEXTROSE 0.75-8.25 % IT SOLN
INTRATHECAL | Status: DC | PRN
  Administered 2012-03-28: 1.5 mL via INTRATHECAL

## 2012-03-28 MED ORDER — PHENYLEPHRINE 40 MCG/ML (10ML) SYRINGE FOR IV PUSH (FOR BLOOD PRESSURE SUPPORT)
PREFILLED_SYRINGE | INTRAVENOUS | Status: AC
  Filled 2012-03-28: qty 5

## 2012-03-28 MED ORDER — PROMETHAZINE HCL 25 MG/ML IJ SOLN
6.2500 mg | INTRAMUSCULAR | Status: DC | PRN
  Administered 2012-03-28: 6.25 mg via INTRAVENOUS

## 2012-03-28 MED ORDER — IBUPROFEN 600 MG PO TABS
600.0000 mg | ORAL_TABLET | Freq: Four times a day (QID) | ORAL | Status: DC
  Administered 2012-03-29 – 2012-03-31 (×10): 600 mg via ORAL
  Filled 2012-03-28 (×10): qty 1

## 2012-03-28 MED ORDER — SODIUM CHLORIDE 0.9 % IJ SOLN: 3.0000 mL | INTRAMUSCULAR | Status: DC | PRN

## 2012-03-28 MED ORDER — ONDANSETRON HCL 4 MG/2ML IJ SOLN: 4.0000 mg | INTRAMUSCULAR | Status: DC | PRN

## 2012-03-28 MED ORDER — TETANUS-DIPHTH-ACELL PERTUSSIS 5-2.5-18.5 LF-MCG/0.5 IM SUSP: 0.5000 mL | Freq: Once | INTRAMUSCULAR | Status: DC

## 2012-03-28 MED ORDER — EPHEDRINE SULFATE 50 MG/ML IJ SOLN
INTRAMUSCULAR | Status: DC | PRN
  Administered 2012-03-28: 10 mg via INTRAVENOUS

## 2012-03-28 MED ORDER — NALBUPHINE SYRINGE 5 MG/0.5 ML
INJECTION | INTRAMUSCULAR | Status: AC
  Administered 2012-03-28: 10 mg via INTRAVENOUS
  Filled 2012-03-28: qty 1

## 2012-03-28 MED ORDER — DIPHENHYDRAMINE HCL 50 MG/ML IJ SOLN: 25.0000 mg | INTRAMUSCULAR | Status: DC | PRN

## 2012-03-28 MED ORDER — PROMETHAZINE HCL 25 MG/ML IJ SOLN
INTRAMUSCULAR | Status: AC
  Administered 2012-03-28: 6.25 mg via INTRAVENOUS
  Filled 2012-03-28: qty 1

## 2012-03-28 MED ORDER — CEFAZOLIN SODIUM-DEXTROSE 2-3 GM-% IV SOLR
2.0000 g | INTRAVENOUS | Status: AC
  Administered 2012-03-28: 2 g via INTRAVENOUS

## 2012-03-28 MED ORDER — ZOLPIDEM TARTRATE 5 MG PO TABS: 5.0000 mg | ORAL_TABLET | Freq: Every evening | ORAL | Status: DC | PRN

## 2012-03-28 MED ORDER — PRENATAL MULTIVITAMIN CH
1.0000 | ORAL_TABLET | Freq: Every day | ORAL | Status: DC
  Administered 2012-03-29 – 2012-03-31 (×3): 1 via ORAL
  Filled 2012-03-28 (×3): qty 1

## 2012-03-28 MED ORDER — ONDANSETRON HCL 4 MG/2ML IJ SOLN
INTRAMUSCULAR | Status: AC
  Filled 2012-03-28: qty 2

## 2012-03-28 MED ORDER — NALOXONE HCL 0.4 MG/ML IJ SOLN: 0.4000 mg | INTRAMUSCULAR | Status: DC | PRN

## 2012-03-28 MED ORDER — ONDANSETRON HCL 4 MG PO TABS: 4.0000 mg | ORAL_TABLET | ORAL | Status: DC | PRN

## 2012-03-28 MED ORDER — LACTATED RINGERS IV SOLN
INTRAVENOUS | Status: DC
  Administered 2012-03-29: 03:00:00 via INTRAVENOUS

## 2012-03-28 MED ORDER — LACTATED RINGERS IV SOLN
INTRAVENOUS | Status: DC
  Administered 2012-03-28 (×4): via INTRAVENOUS

## 2012-03-28 MED ORDER — KETOROLAC TROMETHAMINE 30 MG/ML IJ SOLN
30.0000 mg | Freq: Four times a day (QID) | INTRAMUSCULAR | Status: AC | PRN
  Administered 2012-03-28: 30 mg via INTRAVENOUS

## 2012-03-28 MED ORDER — MEASLES, MUMPS & RUBELLA VAC ~~LOC~~ INJ
0.5000 mL | INJECTION | Freq: Once | SUBCUTANEOUS | Status: DC
  Filled 2012-03-28: qty 0.5

## 2012-03-28 MED ORDER — SCOPOLAMINE 1 MG/3DAYS TD PT72
1.0000 | MEDICATED_PATCH | Freq: Once | TRANSDERMAL | Status: DC
  Administered 2012-03-28: 1.5 mg via TRANSDERMAL

## 2012-03-28 MED ORDER — CEFAZOLIN SODIUM-DEXTROSE 2-3 GM-% IV SOLR
INTRAVENOUS | Status: AC
  Filled 2012-03-28: qty 50

## 2012-03-28 MED ORDER — DIPHENHYDRAMINE HCL 25 MG PO CAPS: 25.0000 mg | ORAL_CAPSULE | Freq: Four times a day (QID) | ORAL | Status: DC | PRN

## 2012-03-28 MED ORDER — SCOPOLAMINE 1 MG/3DAYS TD PT72
MEDICATED_PATCH | TRANSDERMAL | Status: AC
  Administered 2012-03-28: 1.5 mg via TRANSDERMAL
  Filled 2012-03-28: qty 1

## 2012-03-28 MED ORDER — SCOPOLAMINE 1 MG/3DAYS TD PT72: 1.0000 | MEDICATED_PATCH | Freq: Once | TRANSDERMAL | Status: DC

## 2012-03-28 MED ORDER — ONDANSETRON HCL 4 MG/2ML IJ SOLN
INTRAMUSCULAR | Status: DC | PRN
  Administered 2012-03-28: 4 mg via INTRAVENOUS

## 2012-03-28 MED ORDER — OXYTOCIN 10 UNIT/ML IJ SOLN
INTRAMUSCULAR | Status: AC
  Filled 2012-03-28: qty 4

## 2012-03-28 MED ORDER — DIBUCAINE 1 % RE OINT: 1.0000 "application " | TOPICAL_OINTMENT | RECTAL | Status: DC | PRN

## 2012-03-28 MED ORDER — KETOROLAC TROMETHAMINE 30 MG/ML IJ SOLN: 30.0000 mg | Freq: Four times a day (QID) | INTRAMUSCULAR | Status: AC | PRN

## 2012-03-28 MED ORDER — MEDROXYPROGESTERONE ACETATE 150 MG/ML IM SUSP: 150.0000 mg | INTRAMUSCULAR | Status: DC | PRN

## 2012-03-28 MED ORDER — PHENYLEPHRINE HCL 10 MG/ML IJ SOLN
INTRAMUSCULAR | Status: DC | PRN
  Administered 2012-03-28 (×5): 120 ug via INTRAVENOUS

## 2012-03-28 MED ORDER — ONDANSETRON HCL 4 MG/2ML IJ SOLN: 4.0000 mg | Freq: Three times a day (TID) | INTRAMUSCULAR | Status: DC | PRN

## 2012-03-28 SURGICAL SUPPLY — 23 items
APL SKNCLS STERI-STRIP NONHPOA (GAUZE/BANDAGES/DRESSINGS) ×1
BENZOIN TINCTURE PRP APPL 2/3 (GAUZE/BANDAGES/DRESSINGS) ×2 IMPLANT
CHLORAPREP W/TINT 26ML (MISCELLANEOUS) ×2 IMPLANT
CLOTH BEACON ORANGE TIMEOUT ST (SAFETY) ×2 IMPLANT
DRSG COVADERM 4X10 (GAUZE/BANDAGES/DRESSINGS) ×1 IMPLANT
ELECT REM PT RETURN 9FT ADLT (ELECTROSURGICAL) ×2
ELECTRODE REM PT RTRN 9FT ADLT (ELECTROSURGICAL) ×1 IMPLANT
EXTRACTOR VACUUM M CUP 4 TUBE (SUCTIONS) ×1 IMPLANT
GLOVE BIO SURGEON STRL SZ 6.5 (GLOVE) ×4 IMPLANT
GOWN PREVENTION PLUS LG XLONG (DISPOSABLE) ×6 IMPLANT
NS IRRIG 1000ML POUR BTL (IV SOLUTION) ×2 IMPLANT
PACK C SECTION WH (CUSTOM PROCEDURE TRAY) ×2 IMPLANT
PAD ABD 7.5X8 STRL (GAUZE/BANDAGES/DRESSINGS) ×2 IMPLANT
STRIP CLOSURE SKIN 1/2X4 (GAUZE/BANDAGES/DRESSINGS) ×2 IMPLANT
SUT MNCRL 0 VIOLET CTX 36 (SUTURE) ×2 IMPLANT
SUT MNCRL AB 3-0 PS2 27 (SUTURE) ×1 IMPLANT
SUT MONOCRYL 0 CTX 36 (SUTURE) ×2
SUT PDS AB 0 CTX 36 PDP370T (SUTURE) ×3 IMPLANT
SUT VIC AB 2-0 CT1 27 (SUTURE) ×2
SUT VIC AB 2-0 CT1 TAPERPNT 27 (SUTURE) ×1 IMPLANT
TOWEL OR 17X24 6PK STRL BLUE (TOWEL DISPOSABLE) ×4 IMPLANT
TRAY FOLEY CATH 14FR (SET/KITS/TRAYS/PACK) ×2 IMPLANT
WATER STERILE IRR 1000ML POUR (IV SOLUTION) ×2 IMPLANT

## 2012-03-28 NOTE — Interval H&P Note (Signed)
History and Physical Interval Note:  03/28/2012 1:03 PM  [name] Tammy Jordan  has presented today for surgery, with the diagnosis of repeat C section, breech presentation  The various methods of treatment have been discussed with the patient and family. After consideration of risks, benefits and other options for treatment, the patient has consented to  Procedure(s) (LRB): CESAREAN SECTION (N/A) as a surgical intervention .  The patient's history has been reviewed, patient examined, no change in status, stable for surgery.  I have reviewed the patient's chart and labs.  Questions were answered to the patient's satisfaction.     JACKSON-MOORE,Warnell Rasnic A

## 2012-03-28 NOTE — Op Note (Signed)
Cesarean Section Procedure Note   Tammy Jordan   03/28/2012  Indications: Scheduled Proceedure/Maternal Request   Pre-operative Diagnosis: Previous Cesarean Section  Post-operative Diagnosis: Same   Surgeon: Antionette Char A  Assistants: Coral Ceo A  Anesthesia: spinal  Procedure Details:  The patient was seen in the Holding Room. The risks, benefits, complications, treatment options, and expected outcomes were discussed with the patient. The patient concurred with the proposed plan, giving informed consent. The patient was identified as Tammy Jordan and the procedure verified as C-Section Delivery. A Time Out was held and the above information confirmed.  After induction of anesthesia, the patient was draped and prepped in the usual sterile manner. A transverse incision was made and carried down through the subcutaneous tissue to the fascia. The fascial incision was made and extended transversely. The fascia was separated from the underlying rectus tissue superiorly and inferiorly. The peritoneum was identified and entered. The peritoneal incision was extended longitudinally. The utero-vesical peritoneal reflection was incised transversely and the bladder flap was bluntly freed from the lower uterine segment. A low transverse uterine incision was made. Delivered from cephalic presentation was a living newborn female infant.  The delivery was vacuum-assisted.  There was a body cord.  A cord ph was not sent. The umbilical cord was clamped and cut cord. A sample was obtained for evaluation. The placenta was removed Intact and appeared normal.  The uterine incision was closed with running locked sutures of 1-0 Monocryl. A second imbricating layer of the same suture was placed.  Hemostasis was observed. The paracolic gutters were irrigated. The fascia was then reapproximated with running sutures of 1-0 PDS. The subcuticular closure was performed using 3-0 Monocryl.  Instrument, sponge, and  needle counts were correct prior the abdominal closure and were correct at the conclusion of the case.    Findings:  See above   Estimated Blood Loss: 400 ml  Total IV Fluids: per Anesthesiology  Urine Output: per Anesthesiology  Specimens: @ORSPECIMEN @   Complications: no complications  Disposition: PACU - hemodynamically stable.  Maternal Condition: stable   Baby condition / location:  nursery-stable    Signed: Surgeon(s): Antionette Char, MD Brock Bad, MD

## 2012-03-28 NOTE — Anesthesia Postprocedure Evaluation (Signed)
Anesthesia Post Note  Patient: Tammy Jordan  Procedure(s) Performed: Procedure(s) (LRB): CESAREAN SECTION (N/A)  Anesthesia type: Spinal  Patient location: PACU  Post pain: Pain level controlled  Post assessment: Post-op Vital signs reviewed  Last Vitals:  Filed Vitals:   03/28/12 1745  BP: 115/73  Pulse: 86  Temp:   Resp: 16    Post vital signs: Reviewed  Level of consciousness: awake  Complications: No apparent anesthesia complications

## 2012-03-28 NOTE — Anesthesia Preprocedure Evaluation (Signed)
Anesthesia Evaluation  Patient identified by MRN, date of birth, ID band Patient awake    Reviewed: Allergy & Precautions, H&P , NPO status , Patient's Chart, lab work & pertinent test results  History of Anesthesia Complications (+) PONV  Airway Mallampati: II      Dental No notable dental hx.    Pulmonary neg pulmonary ROS,  breath sounds clear to auscultation  Pulmonary exam normal       Cardiovascular Exercise Tolerance: Good negative cardio ROS  Rhythm:regular Rate:Normal     Neuro/Psych negative neurological ROS  negative psych ROS   GI/Hepatic negative GI ROS, Neg liver ROS, GERD-  Controlled,  Endo/Other  negative endocrine ROS  Renal/GU negative Renal ROS  negative genitourinary   Musculoskeletal   Abdominal Normal abdominal exam  (+)   Peds  Hematology negative hematology ROS (+)   Anesthesia Other Findings PONV (postoperative nausea and vomiting)     Endometriosis        Sickle cell trait     Heartburn in pregnancy        GERD (gastroesophageal reflux disease) 2013 severe (per pt) heartburn- on meds but milk works better Headache   migraines    Pregnancy induced hypertension   PIH with 1st baby    Reproductive/Obstetrics (+) Pregnancy                           Anesthesia Physical Anesthesia Plan  ASA: II  Anesthesia Plan: Spinal   Post-op Pain Management:    Induction:   Airway Management Planned:   Additional Equipment:   Intra-op Plan:   Post-operative Plan:   Informed Consent: I have reviewed the patients History and Physical, chart, labs and discussed the procedure including the risks, benefits and alternatives for the proposed anesthesia with the patient or authorized representative who has indicated his/her understanding and acceptance.     Plan Discussed with: Anesthesiologist, CRNA and Surgeon  Anesthesia Plan Comments:         Anesthesia Quick  Evaluation

## 2012-03-28 NOTE — Transfer of Care (Signed)
Immediate Anesthesia Transfer of Care Note  Patient: Tammy Jordan  Procedure(s) Performed: Procedure(s) (LRB): CESAREAN SECTION (N/A)  Patient Location: PACU  Anesthesia Type: Spinal  Level of Consciousness: awake, alert  and oriented  Airway & Oxygen Therapy: Patient Spontanous Breathing  Post-op Assessment: Report given to PACU RN and Post -op Vital signs reviewed and stable  Post vital signs: stable  Complications: No apparent anesthesia complications

## 2012-03-28 NOTE — Anesthesia Procedure Notes (Signed)
Spinal  Patient location during procedure: OR Start time: 03/28/2012 3:24 PM Staffing Anesthesiologist: Brayton Caves R Performed by: anesthesiologist  Preanesthetic Checklist Completed: patient identified, site marked, surgical consent, pre-op evaluation, timeout performed, IV checked, risks and benefits discussed and monitors and equipment checked Spinal Block Patient position: sitting Prep: DuraPrep Patient monitoring: heart rate, cardiac monitor, continuous pulse ox and blood pressure Approach: midline Location: L3-4 Injection technique: single-shot Needle Needle type: Sprotte  Needle gauge: 24 G Needle length: 9 cm Assessment Sensory level: T4 Additional Notes Patient identified.  Risk benefits discussed including failed block, incomplete pain control, headache, nerve damage, paralysis, blood pressure changes, nausea, vomiting, reactions to medication both toxic or allergic, and postpartum back pain.  Patient expressed understanding and wished to proceed.  All questions were answered.  Sterile technique used throughout procedure.  CSF was clear.  No parasthesia or other complications.  Please see nursing notes for vital signs.

## 2012-03-29 LAB — HEMOGLOBIN: Hemoglobin: 8.1 g/dL — ABNORMAL LOW (ref 12.0–15.0)

## 2012-03-29 NOTE — Anesthesia Postprocedure Evaluation (Signed)
  Anesthesia Post-op Note  Patient: Tammy Jordan  Procedure(s) Performed: Procedure(s) (LRB): CESAREAN SECTION (N/A)  Patient Location: PACU and Mother/Baby  Anesthesia Type: Spinal  Level of Consciousness: awake, alert  and oriented  Airway and Oxygen Therapy: Patient Spontanous Breathing  Post-op Pain: none  Post-op Assessment: Post-op Vital signs reviewed, No signs of Nausea or vomiting, Pain level controlled, No headache, No backache, No residual numbness and No residual motor weakness  Post-op Vital Signs: Reviewed and stable  Complications: No apparent anesthesia complications

## 2012-03-29 NOTE — Progress Notes (Signed)
Subjective: Postpartum Day 1: Cesarean Delivery Patient reports incisional pain.    Objective: Vital signs in last 24 hours: Temp:  [97.8 F (36.6 C)-98.6 F (37 C)] 98.5 F (36.9 C) (08/03 0452) Pulse Rate:  [83-114] 102  (08/03 0452) Resp:  [12-20] 18  (08/03 0452) BP: (104-133)/(66-90) 115/67 mmHg (08/03 0452) SpO2:  [95 %-100 %] 98 % (08/03 0452) Weight:  [86.183 kg (190 lb)] 86.183 kg (190 lb) (08/02 1831)  Physical Exam:  General: alert and no distress Lochia: appropriate Uterine Fundus: firm Incision: healing well DVT Evaluation: No evidence of DVT seen on physical exam.   Basename 03/29/12 0555  HGB 8.1*  HCT --    Assessment/Plan: Status post Cesarean section. Doing well postoperatively.  Continue current care.  Dennie Moltz A 03/29/2012, 9:33 AM

## 2012-03-29 NOTE — Anesthesia Postprocedure Evaluation (Signed)
  Anesthesia Post-op Note  Patient: Tammy Jordan  Procedure(s) Performed: Procedure(s) (LRB): CESAREAN SECTION (N/A)  Patient Location: PACU and Mother/Baby  Anesthesia Type: Spinal  Level of Consciousness: awake, alert , oriented and patient cooperative  Airway and Oxygen Therapy: Patient Spontanous Breathing  Post-op Pain: none  Post-op Assessment: Post-op Vital signs reviewed, No headache, No backache, No residual numbness and No residual motor weakness  Post-op Vital Signs: Reviewed and stable  Complications: No apparent anesthesia complications

## 2012-03-30 NOTE — Progress Notes (Signed)
Subjective: Postpartum Day 2: Cesarean Delivery Patient reports tolerating PO, + flatus and no problems voiding.    Objective: Vital signs in last 24 hours: Temp:  [97.8 F (36.6 C)-98.6 F (37 C)] 98.6 F (37 C) (08/04 0630) Pulse Rate:  [96-106] 106  (08/04 0630) Resp:  [18-20] 20  (08/04 0630) BP: (106-108)/(67-70) 108/68 mmHg (08/04 0630)  Physical Exam:  General: alert and no distress Lochia: appropriate Uterine Fundus: firm Incision: healing well DVT Evaluation: No evidence of DVT seen on physical exam.   Basename 03/29/12 0555  HGB 8.1*  HCT --    Assessment/Plan: Status post Cesarean section. Doing well postoperatively.  Continue current care.  HARPER,CHARLES A 03/30/2012, 8:38 AM

## 2012-03-31 ENCOUNTER — Encounter (HOSPITAL_COMMUNITY): Payer: Self-pay | Admitting: Obstetrics & Gynecology

## 2012-03-31 DIAGNOSIS — IMO0002 Reserved for concepts with insufficient information to code with codable children: Secondary | ICD-10-CM | POA: Diagnosis present

## 2012-03-31 MED ORDER — OXYCODONE-ACETAMINOPHEN 5-325 MG PO TABS: 1.0000 | ORAL_TABLET | Freq: Four times a day (QID) | ORAL | Status: AC | PRN

## 2012-03-31 NOTE — Progress Notes (Signed)
UR chart review completed.  

## 2012-03-31 NOTE — Discharge Summary (Signed)
  Obstetric Discharge Summary Reason for Admission: cesarean section Prenatal Procedures: none Intrapartum Procedures: cesarean: low cervical, transverse Postpartum Procedures: none Complications-Operative and Postpartum: none  Hemoglobin  Date Value Range Status  03/29/2012 8.1* 12.0 - 15.0 g/dL Final     HCT  Date Value Range Status  03/21/2012 31.3* 36.0 - 46.0 % Final    Physical Exam:  General: alert Lochia: appropriate Uterine: firm Incision: clean, dry and intact DVT Evaluation: No evidence of DVT seen on physical exam.  Discharge Diagnoses: Active Problems:  Cesarean delivery, without mention of indication, delivered, with or without mention of antepartum condition  Maternal anemia complicating pregnancy, childbirth, or the puerperium   Discharge Information: Date: 03/31/2012 Activity: pelvic rest Diet: routine Medications:  Prior to Admission medications   Medication Sig Start Date End Date Taking? Authorizing Provider  famotidine (PEPCID) 10 MG tablet Take 10 mg by mouth daily.    Historical Provider, MD  oxyCODONE-acetaminophen (PERCOCET/ROXICET) 5-325 MG per tablet Take 1-2 tablets by mouth every 6 (six) hours as needed (moderate - severe pain). 03/31/12 04/10/12  Antionette Char, MD  Prenatal Vit-Fe Fumarate-FA (PRENATAL MULTIVITAMIN) TABS Take 1 tablet by mouth daily.    Historical Provider, MD    Condition: stable Instructions: refer to routine discharge instructions Discharge to: home Follow-up Information    Follow up with Antionette Char A, MD. Schedule an appointment as soon as possible for a visit in 2 weeks.   Contact information:   [address], [address] [place] [address] [phone]          Newborn Data: Live born  Information for the patient's newborn:  Allyce, Bochicchio [[id number]]  female   Home with mother.  JACKSON-MOORE,Domonique Brouillard A 03/31/2012, 7:53 AM

## 2012-10-21 IMAGING — US US OB DETAIL+14 WK
1 of 2 series · 12 of 28 positions shown · non-contrast
Comparison: none

[Series 1: us ob detail +14 wk · 84 acquisitions, 12 frames shown]
[im 1/84]
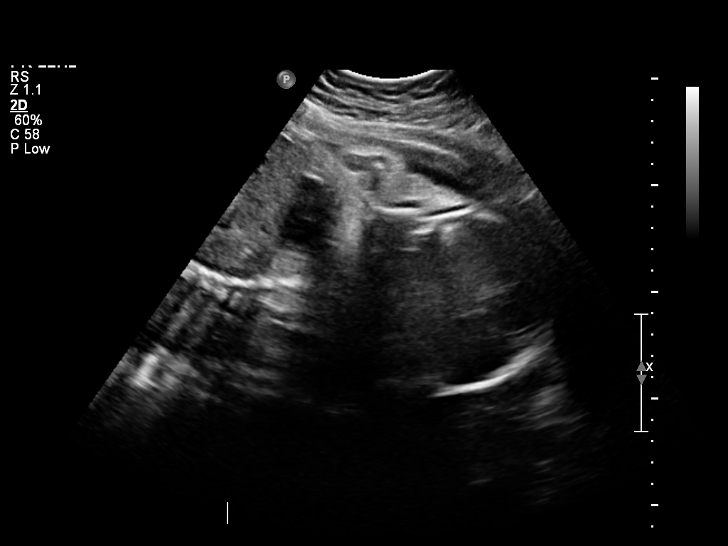
[im 7/84]
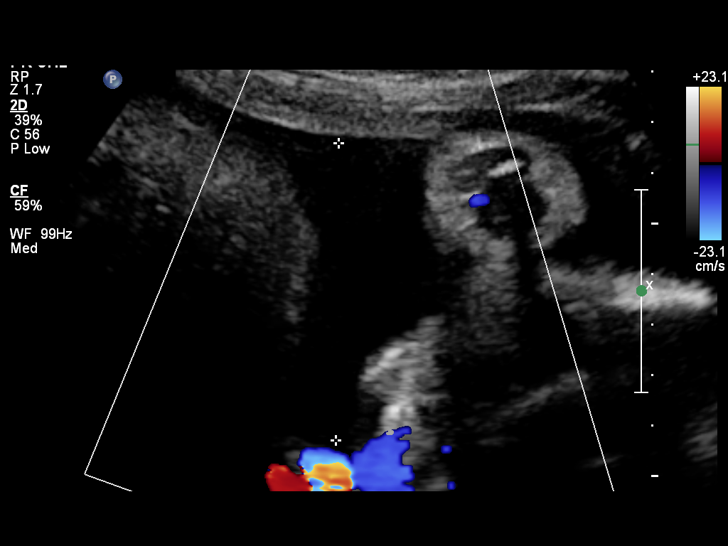
[im 13/84]
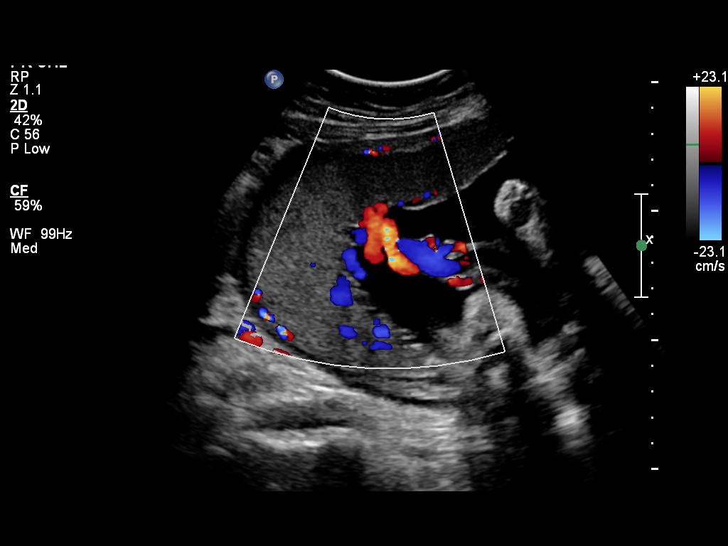
[im 23/84]
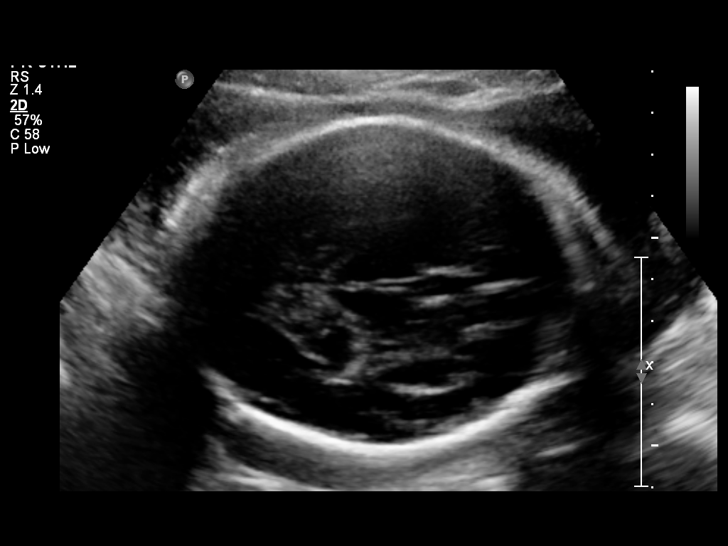
[im 29/84]
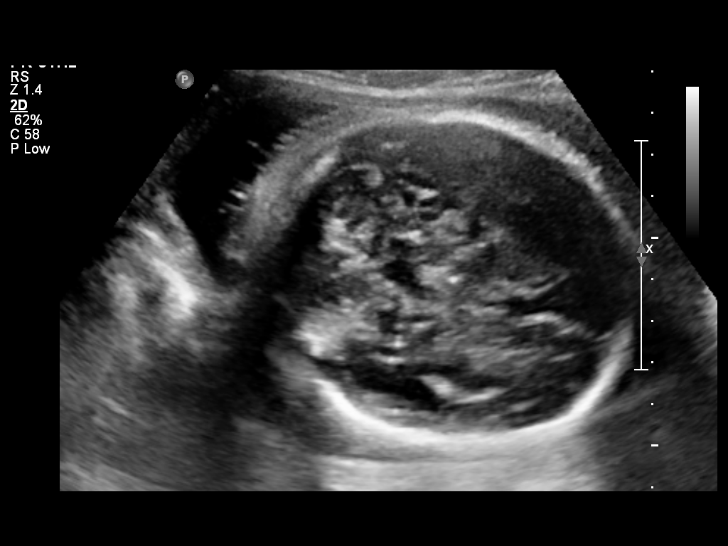
[im 36/84]
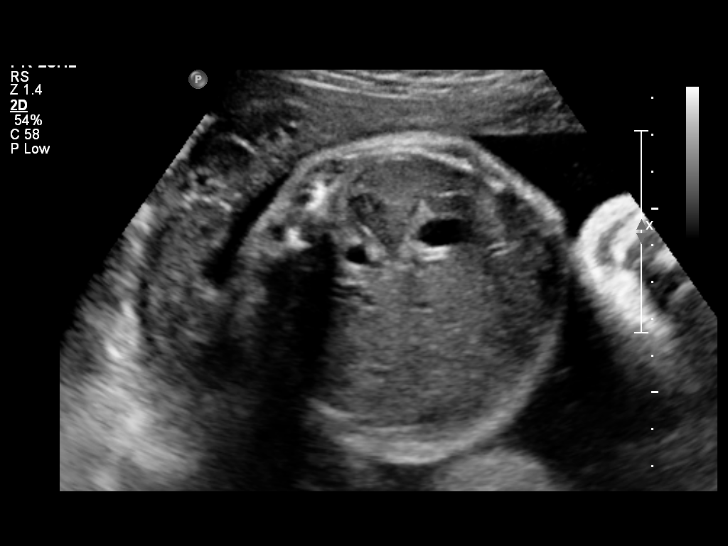
[im 45/84]
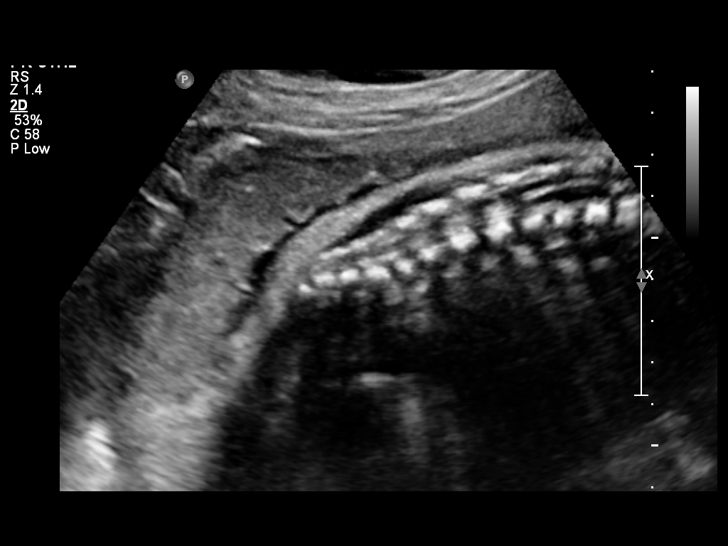
[im 52/84]
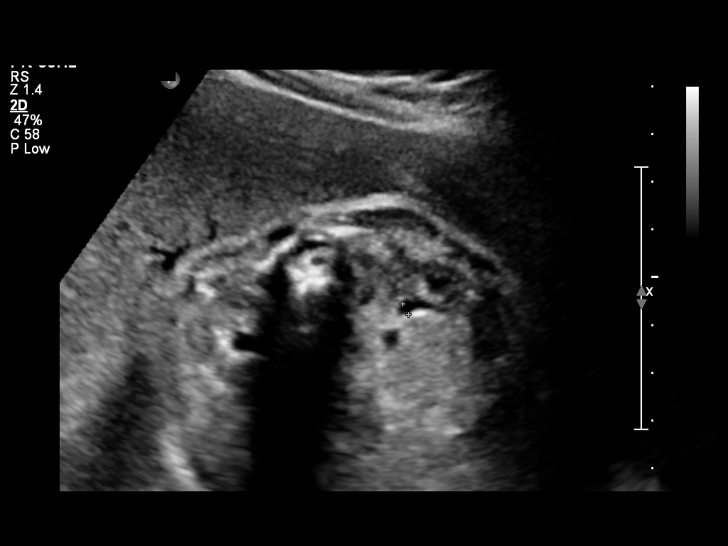
[im 58/84]
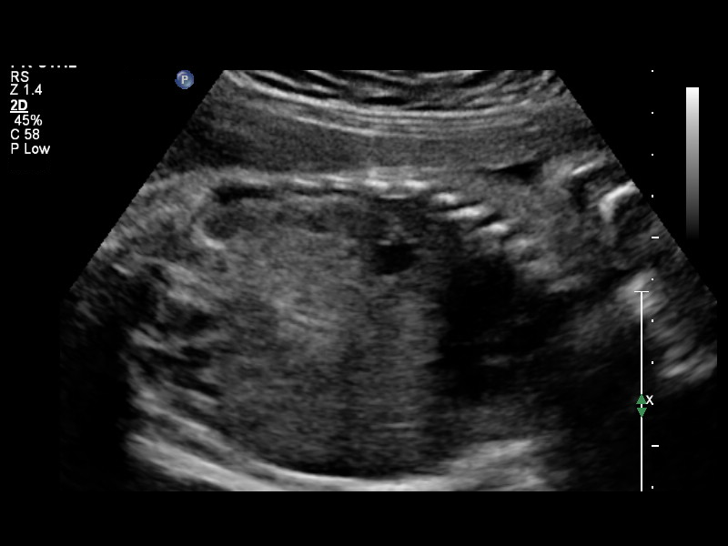
[im 68/84]
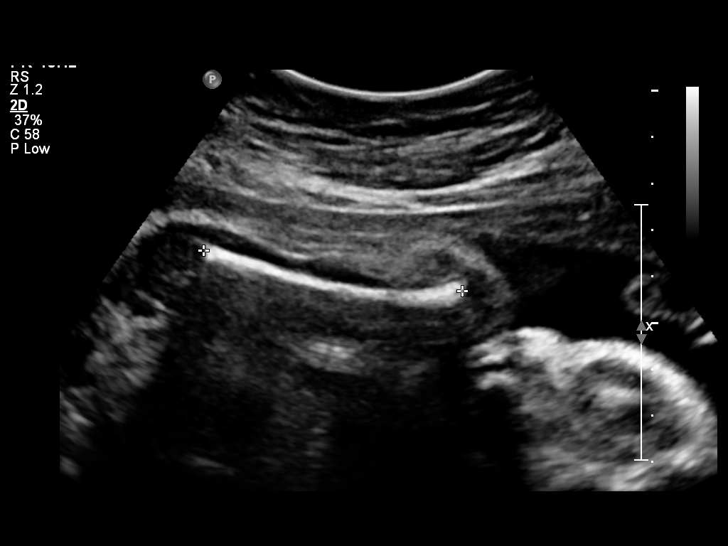
[im 74/84]
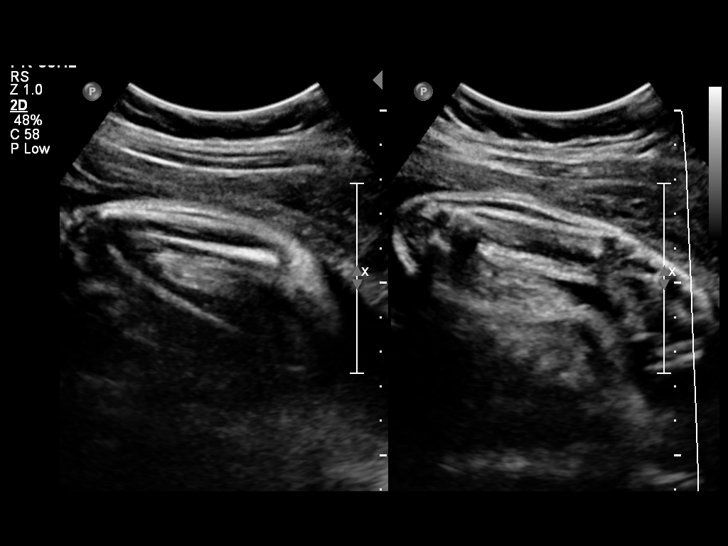
[im 80/84]
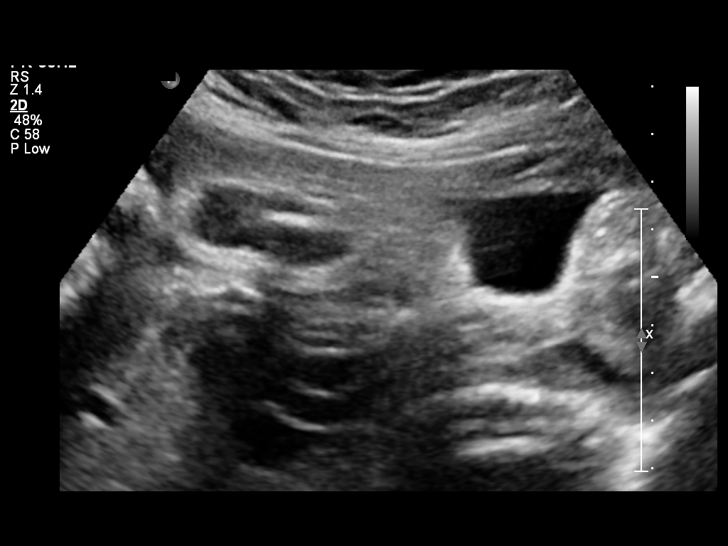

[12 of 28 positions shown; findings below may reference images not displayed]

OBSTETRICS REPORT
                      (Signed Final 02/04/2012 [DATE])

 Order#:         [id number]
Procedures

 US OB DETAIL + 14 WK                                  76811.0
Indications

 Detailed fetal anatomic survey
 Poor obstetric history-Recurrent (habitual) abortion
 (2 consecutive ab's)
 Hypertension - Gestational
 Poor obstetric history: Previous preeclampsia /
 eclampsia/gestational HTN
Fetal Evaluation

 Fetal Heart Rate:  155                         bpm
 Cardiac Activity:  Observed
 Presentation:      Cephalic
 Placenta:          Fundal, above cervical os
 P. Cord            Visualized
 Insertion:

 Amniotic Fluid
 AFI FV:      Subjectively within normal limits
 AFI Sum:     14.26   cm      49   %Tile     Larg Pckt:   5.85   cm
 RUQ:   3.04   cm    RLQ:    1.94   cm    LUQ:   3.43    cm   LLQ:    5.85   cm
Biometry

 BPD:     78.9  mm    G. Age:   31w 5d                CI:         73.9   70 - 86
                                                      FL/HC:      22.0   19.1 -

 HC:     291.5  mm    G. Age:   32w 1d       10  %    HC/AC:      1.00   0.96 -

 AC:       291  mm    G. Age:   33w 1d       69  %    FL/BPD:     81.1   71 - 87
 FL:        64  mm    G. Age:   33w 1d       55  %    FL/AC:      22.0   20 - 24
 HUM:     56.7  mm    G. Age:   33w 0d       64  %

 Est. FW:    7075  gm      4 lb 9 oz     65  %
Gestational Age

 Clinical EDD:  32w 3d                                        EDD:   03/28/12
 U/S Today:     32w 4d                                        EDD:   03/27/12
 Best:          32w 3d    Det. By:   Clinical EDD             EDD:   03/28/12
Anatomy

 Cranium:           Appears normal      Aortic Arch:       Appears normal
 Fetal Cavum:       Appears normal      Ductal Arch:       Appears normal
 Ventricles:        Appears normal      Diaphragm:         Appears normal
 Choroid Plexus:    Appears normal      Stomach:           Appears
                                                           normal, left
                                                           sided
 Cerebellum:        Appears normal      Abdomen:           Appears normal
 Posterior Fossa:   Appears normal      Abdominal Wall:    Not well
                                                           visualized
 Nuchal Fold:       Not applicable      Cord Vessels:      Appears normal
                    (>20 wks GA)                           (3 vessel cord)
 Face:              Lips and profile    Kidneys:           Appear normal
                    appear normal
 Heart:             Appears normal      Bladder:           Appears normal
                    (4 chamber &
                    axis)
 RVOT:              Appears normal      Spine:             Appears normal
 LVOT:              Appears normal      Limbs:             Appears normal
                                                           (hands, ankles,
                                                           feet)

 Other:     Fetus appears to be a male. Heels visualized. Nasal
            bone visualized. Technically difficult due to advanced GA.
Targeted Anatomy

 Fetal Central Nervous System
 Lat. Ventricles:   4.1                 Cisterna Magna:
Cervix Uterus Adnexa

 Cervical Length:   4.43      cm

 Cervix:       Normal appearance by transabdominal scan.
Impression

 Single living intrauterine pregnancy in cephalic presentation.
 The estimated gestational age is 32w 3d based on Clinical
 EDD. No fetal anomalies are identified. Anatomic survey is
 limited by gestational age.

 questions or concerns.

## 2013-06-14 ENCOUNTER — Other Ambulatory Visit: Payer: Self-pay | Admitting: Obstetrics & Gynecology

## 2013-06-15 ENCOUNTER — Encounter: Payer: Self-pay | Admitting: Obstetrics & Gynecology

## 2013-07-27 ENCOUNTER — Ambulatory Visit: Payer: Self-pay | Admitting: Obstetrics & Gynecology

## 2013-09-28 ENCOUNTER — Other Ambulatory Visit: Payer: Self-pay | Admitting: *Deleted

## 2013-09-28 DIAGNOSIS — IMO0001 Reserved for inherently not codable concepts without codable children: Secondary | ICD-10-CM

## 2013-09-28 MED ORDER — ETONOGESTREL-ETHINYL ESTRADIOL 0.12-0.015 MG/24HR VA RING: 1.0000 | VAGINAL_RING | VAGINAL | Status: DC

## 2013-09-29 ENCOUNTER — Encounter: Payer: Self-pay | Admitting: Obstetrics & Gynecology

## 2013-10-26 ENCOUNTER — Ambulatory Visit (INDEPENDENT_AMBULATORY_CARE_PROVIDER_SITE_OTHER): Payer: BC Managed Care – PPO | Admitting: Obstetrics & Gynecology

## 2013-10-26 ENCOUNTER — Encounter: Payer: Self-pay | Admitting: Obstetrics & Gynecology

## 2013-10-26 VITALS — BP 132/89 | HR 84 | Temp 99.2°F | Ht 64.0 in | Wt 173.0 lb

## 2013-10-26 DIAGNOSIS — Z0000 Encounter for general adult medical examination without abnormal findings: Secondary | ICD-10-CM

## 2013-10-26 DIAGNOSIS — Z01419 Encounter for gynecological examination (general) (routine) without abnormal findings: Secondary | ICD-10-CM

## 2013-10-26 DIAGNOSIS — N644 Mastodynia: Secondary | ICD-10-CM

## 2013-10-26 DIAGNOSIS — Z124 Encounter for screening for malignant neoplasm of cervix: Secondary | ICD-10-CM

## 2013-10-26 DIAGNOSIS — N926 Irregular menstruation, unspecified: Secondary | ICD-10-CM

## 2013-10-26 LAB — POCT URINALYSIS DIPSTICK
BILIRUBIN UA: NEGATIVE
GLUCOSE UA: NEGATIVE
KETONES UA: NEGATIVE
LEUKOCYTES UA: NEGATIVE
NITRITE UA: NEGATIVE
PH UA: 6
Protein, UA: NEGATIVE
RBC UA: NEGATIVE
Spec Grav, UA: 1.01
Urobilinogen, UA: NEGATIVE

## 2013-10-26 LAB — POCT URINE PREGNANCY: Preg Test, Ur: NEGATIVE

## 2013-10-26 NOTE — Patient Instructions (Signed)
Breast Tenderness Breast tenderness is a common problem for women of all ages. Breast tenderness may cause mild discomfort to severe pain. It has a variety of causes. Your health care provider will find out the likely cause of your breast tenderness by examining your breasts, asking you about symptoms, and ordering some tests. Breast tenderness usually does not mean you have breast cancer. HOME CARE INSTRUCTIONS  Breast tenderness often can be handled at home. You can try:  Getting fitted for a new bra that provides more support, especially during exercise.  Wearing a more supportive bra or sports bra while sleeping when your breasts are very tender.  If you have a breast injury, apply ice to the area:  Put ice in a plastic bag.  Place a towel between your skin and the bag.  Leave the ice on for 20 minutes, 2 3 times a day.  If your breasts are too full of milk as a result of breastfeeding, try:  Expressing milk either by hand or with a breast pump.  Applying a warm compress to the breasts for relief.  Taking over-the-counter pain relievers, if approved by your health care provider.  Taking other medicines that your health care provider prescribes. These may include antibiotic medicines or birth control pills. Over the long term, your breast tenderness might be eased if you:  Cut down on caffeine.  Reduce the amount of fat in your diet. Keep a log of the days and times when your breasts are most tender. This will help you and your health care provider find the cause of the tenderness and how to relieve it. Also, learn how to do breast exams at home. This will help you notice if you have an unusual growth or lump that could cause tenderness. SEEK MEDICAL CARE IF:   Any part of your breast is hard, red, and hot to the touch. This could be a sign of infection.  Fluid is coming out of your nipples (and you are not breastfeeding). Especially watch for blood or pus.  You have a  fever as well as breast tenderness.  You have a new or painful lump in your breast that remains after your menstrual period ends.  You have tried to take care of the pain at home, but it has not gone away.  Your breast pain is getting worse, or the pain is making it hard to do the things you usually do during your day. Document Released: 07/26/2008 Document Revised: 04/15/2013 Document Reviewed: 03/12/2013 [facility] Patient Information 2014 [place], [place]. Health Maintenance, Female A healthy lifestyle and preventative care can promote health and wellness.  Maintain regular health, dental, and eye exams.  Eat a healthy diet. Foods like vegetables, fruits, whole grains, low-fat dairy products, and lean protein foods contain the nutrients you need without too many calories. Decrease your intake of foods high in solid fats, added sugars, and salt. Get information about a proper diet from your caregiver, if necessary.  Regular physical exercise is one of the most important things you can do for your health. Most adults should get at least 150 minutes of moderate-intensity exercise (any activity that increases your heart rate and causes you to sweat) each week. In addition, most adults need muscle-strengthening exercises on 2 or more days a week.   Maintain a healthy weight. The body mass index (BMI) is a screening tool to identify possible weight problems. It provides an estimate of body fat based on height and weight. Your caregiver can  help determine your BMI, and can help you achieve or maintain a healthy weight. For adults 20 years and older:  A BMI below 18.5 is considered underweight.  A BMI of 18.5 to 24.9 is normal.  A BMI of 25 to 29.9 is considered overweight.  A BMI of 30 and above is considered obese.  Maintain normal blood lipids and cholesterol by exercising and minimizing your intake of saturated fat. Eat a balanced diet with plenty of fruits and vegetables. Blood tests for  lipids and cholesterol should begin at age 85 and be repeated every 5 years. If your lipid or cholesterol levels are high, you are over 50, or you are a high risk for heart disease, you may need your cholesterol levels checked more frequently.Ongoing high lipid and cholesterol levels should be treated with medicines if diet and exercise are not effective.  If you smoke, find out from your caregiver how to quit. If you do not use tobacco, do not start.  Lung cancer screening is recommended for adults aged 87 80 years who are at high risk for developing lung cancer because of a history of smoking. Yearly low-dose computed tomography (CT) is recommended for people who have at least a 30-pack-year history of smoking and are a current smoker or have quit within the past 15 years. A pack year of smoking is smoking an average of 1 pack of cigarettes a day for 1 year (for example: 1 pack a day for 30 years or 2 packs a day for 15 years). Yearly screening should continue until the smoker has stopped smoking for at least 15 years. Yearly screening should also be stopped for people who develop a health problem that would prevent them from having lung cancer treatment.  If you are pregnant, do not drink alcohol. If you are breastfeeding, be very cautious about drinking alcohol. If you are not pregnant and choose to drink alcohol, do not exceed 1 drink per day. One drink is considered to be 12 ounces (355 mL) of beer, 5 ounces (148 mL) of wine, or 1.5 ounces (44 mL) of liquor.  Avoid use of street drugs. Do not share needles with anyone. Ask for help if you need support or instructions about stopping the use of drugs.  High blood pressure causes heart disease and increases the risk of stroke. Blood pressure should be checked at least every 1 to 2 years. Ongoing high blood pressure should be treated with medicines, if weight loss and exercise are not effective.  If you are 12 to 30 years old, ask your caregiver if  you should take aspirin to prevent strokes.  Diabetes screening involves taking a blood sample to check your fasting blood sugar level. This should be done once every 3 years, after age 45, if you are within normal weight and without risk factors for diabetes. Testing should be considered at a younger age or be carried out more frequently if you are overweight and have at least 1 risk factor for diabetes.  Breast cancer screening is essential preventative care for women. You should practice "breast self-awareness." This means understanding the normal appearance and feel of your breasts and may include breast self-examination. Any changes detected, no matter how small, should be reported to a caregiver. Women in their 56s and 30s should have a clinical breast exam (CBE) by a caregiver as part of a regular health exam every 1 to 3 years. After age 66, women should have a CBE every year. Starting at  age 72, women should consider having a mammogram (breast X-ray) every year. Women who have a family history of breast cancer should talk to their caregiver about genetic screening. Women at a high risk of breast cancer should talk to their caregiver about having an MRI and a mammogram every year.  Breast cancer gene (BRCA)-related cancer risk assessment is recommended for women who have family members with BRCA-related cancers. BRCA-related cancers include breast, ovarian, tubal, and peritoneal cancers. Having family members with these cancers may be associated with an increased risk for harmful changes (mutations) in the breast cancer genes BRCA1 and BRCA2. Results of the assessment will determine the need for genetic counseling and BRCA1 and BRCA2 testing.  The Pap test is a screening test for cervical cancer. Women should have a Pap test starting at age 44. Between ages 78 and 65, Pap tests should be repeated every 2 years. Beginning at age 78, you should have a Pap test every 3 years as long as the past 3 Pap  tests have been normal. If you had a hysterectomy for a problem that was not cancer or a condition that could lead to cancer, then you no longer need Pap tests. If you are between ages 64 and 93, and you have had normal Pap tests going back 10 years, you no longer need Pap tests. If you have had past treatment for cervical cancer or a condition that could lead to cancer, you need Pap tests and screening for cancer for at least 20 years after your treatment. If Pap tests have been discontinued, risk factors (such as a new sexual partner) need to be reassessed to determine if screening should be resumed. Some women have medical problems that increase the chance of getting cervical cancer. In these cases, your caregiver may recommend more frequent screening and Pap tests.  The human papillomavirus (HPV) test is an additional test that may be used for cervical cancer screening. The HPV test looks for the virus that can cause the cell changes on the cervix. The cells collected during the Pap test can be tested for HPV. The HPV test could be used to screen women aged 79 years and older, and should be used in women of any age who have unclear Pap test results. After the age of 70, women should have HPV testing at the same frequency as a Pap test.  Colorectal cancer can be detected and often prevented. Most routine colorectal cancer screening begins at the age of 31 and continues through age 28. However, your caregiver may recommend screening at an earlier age if you have risk factors for colon cancer. On a yearly basis, your caregiver may provide home test kits to check for hidden blood in the stool. Use of a small camera at the end of a tube, to directly examine the colon (sigmoidoscopy or colonoscopy), can detect the earliest forms of colorectal cancer. Talk to your caregiver about this at age 39, when routine screening begins. Direct examination of the colon should be repeated every 5 to 10 years through age 32,  unless early forms of pre-cancerous polyps or small growths are found.  Hepatitis C blood testing is recommended for all people born from [birth year] through 1967 and any individual with known risks for hepatitis C.  Practice safe sex. Use condoms and avoid high-risk sexual practices to reduce the spread of sexually transmitted infections (STIs). Sexually active women aged 74 and younger should be checked for Chlamydia, which is a common sexually transmitted  infection. Older women with new or multiple partners should also be tested for Chlamydia. Testing for other STIs is recommended if you are sexually active and at increased risk.  Osteoporosis is a disease in which the bones lose minerals and strength with aging. This can result in serious bone fractures. The risk of osteoporosis can be identified using a bone density scan. Women ages 83 and over and women at risk for fractures or osteoporosis should discuss screening with their caregivers. Ask your caregiver whether you should be taking a calcium supplement or vitamin D to reduce the rate of osteoporosis.  Menopause can be associated with physical symptoms and risks. Hormone replacement therapy is available to decrease symptoms and risks. You should talk to your caregiver about whether hormone replacement therapy is right for you.  Use sunscreen. Apply sunscreen liberally and repeatedly throughout the day. You should seek shade when your shadow is shorter than you. Protect yourself by wearing long sleeves, pants, a wide-brimmed hat, and sunglasses year round, whenever you are outdoors.  Notify your caregiver of new moles or changes in moles, especially if there is a change in shape or color. Also notify your caregiver if a mole is larger than the size of a pencil eraser.  Stay current with your immunizations. Document Released: 02/26/2011 Document Revised: 12/08/2012 Document Reviewed: 02/26/2011 [facility] Patient Information 2014 [place].

## 2013-10-26 NOTE — Progress Notes (Signed)
Subjective:     Tammy Jordan is a 28 y.o. female here for a routine exam.  Current complaints: Pt states that she is having some pain in both breast.  Pt states that the pain is sharp shooting.  Pt states that it has been occurring for the past couple months.  Pt denies any lumps, tenderness or dischrarge from breast.  Pt has family hx of breast cancer, 2 paternal aunts.  Personal health questionnaire reviewed: yes.   Gynecologic History Patient's last menstrual period was 10/04/2013. Contraception: NuvaRing vaginal inserts Last Pap: 2011. Results were: normal Last mammogram: n/a  Obstetric History OB History  Gravida Para Term Preterm AB SAB TAB Ectopic Multiple Living  4 2 2  2 2    2     # Outcome Date GA Lbr Len/2nd Weight Sex Delivery Anes PTL Lv  4 TRM 03/28/12 [id number]  7 lb 14.8 oz (3.595 kg) M CS-Vac Spinal  Y  3 SAB           2 SAB           1 TRM                The following portions of the patient's history were reviewed and updated as appropriate: allergies, current medications, past family history, past medical history, past social history, past surgical history and problem list.  Review of Systems Pertinent items are noted in HPI.    Objective:    BP 132/89  Pulse 84  Temp(Src) 99.2 F (37.3 C)  Ht 5\' 4"  (1.626 m)  Wt 173 lb (78.472 kg)  BMI 29.68 kg/m2  LMP 10/04/2013  General Appearance:    Alert, cooperative, no distress, appears stated age  Breast Exam:    No tenderness, masses, or nipple abnormality  Abdomen:     Soft, non-tender, bowel sounds active all four quadrants,    no masses, no organomegaly  Genitalia:    Normal female without lesion, discharge or tenderness     Assessment:    Healthy female exam.  Breast pain   Plan:   Follow up in 2 months for breast pain Pap smear done today Referral-->primary care provider

## 2013-10-27 LAB — PAP IG W/ RFLX HPV ASCU

## 2013-11-09 ENCOUNTER — Other Ambulatory Visit: Payer: Self-pay | Admitting: *Deleted

## 2013-11-09 DIAGNOSIS — IMO0001 Reserved for inherently not codable concepts without codable children: Secondary | ICD-10-CM

## 2013-11-09 MED ORDER — ETONOGESTREL-ETHINYL ESTRADIOL 0.12-0.015 MG/24HR VA RING: 1.0000 | VAGINAL_RING | VAGINAL | Status: DC

## 2013-11-19 ENCOUNTER — Encounter: Payer: Self-pay | Admitting: Obstetrics & Gynecology

## 2013-12-02 ENCOUNTER — Encounter: Payer: Self-pay | Admitting: Obstetrics & Gynecology

## 2014-01-11 ENCOUNTER — Ambulatory Visit (INDEPENDENT_AMBULATORY_CARE_PROVIDER_SITE_OTHER): Payer: BC Managed Care – PPO | Admitting: Physician Assistant

## 2014-01-11 VITALS — BP 128/88 | HR 88 | Temp 98.2°F | Resp 18 | Ht 62.5 in | Wt 169.8 lb

## 2014-01-11 DIAGNOSIS — R300 Dysuria: Secondary | ICD-10-CM

## 2014-01-11 DIAGNOSIS — R21 Rash and other nonspecific skin eruption: Secondary | ICD-10-CM

## 2014-01-11 DIAGNOSIS — IMO0001 Reserved for inherently not codable concepts without codable children: Secondary | ICD-10-CM

## 2014-01-11 DIAGNOSIS — R350 Frequency of micturition: Secondary | ICD-10-CM

## 2014-01-11 LAB — POCT SKIN KOH: Skin KOH, POC: NEGATIVE

## 2014-01-11 LAB — POCT UA - MICROSCOPIC ONLY
Casts, Ur, LPF, POC: NEGATIVE
Crystals, Ur, HPF, POC: NEGATIVE
Mucus, UA: NEGATIVE
Yeast, UA: NEGATIVE

## 2014-01-11 LAB — POCT URINALYSIS DIPSTICK
Bilirubin, UA: NEGATIVE
Blood, UA: NEGATIVE
Glucose, UA: NEGATIVE
Ketones, UA: NEGATIVE
Leukocytes, UA: NEGATIVE
Nitrite, UA: NEGATIVE
Protein, UA: NEGATIVE
Spec Grav, UA: 1.015
Urobilinogen, UA: 0.2
pH, UA: 6.5

## 2014-01-11 MED ORDER — CIPROFLOXACIN HCL 250 MG PO TABS: 250.0000 mg | ORAL_TABLET | Freq: Two times a day (BID) | ORAL | Status: DC

## 2014-01-11 MED ORDER — KETOCONAZOLE 2 % EX CREA: 1.0000 "application " | TOPICAL_CREAM | Freq: Every day | CUTANEOUS | Status: DC

## 2014-01-11 NOTE — Progress Notes (Signed)
Subjective:    Patient ID: Tammy Jordan, female    DOB: 11/08/1985, 28 y.o.   MRN: [id number]  HPI 28 year old female presents for evaluation of 2 day history of urinary frequency and discomfort during urination.  Also admits she noted some blood on the tissue when she wiped.  Denies nausea, vomiting, fever, chills, vaginal discharge, or abdominal pain.  Had a UTI last year and had similar symptoms to this.    Also complains of a rash on her right shin.  States it is quite pruritic and does not seem to be improving despite regular tx with OTC hydrocortisone cream.  No hx of eczema or psoriasis.      Review of Systems  Constitutional: Negative for fever and chills.  Gastrointestinal: Negative for nausea, vomiting and abdominal pain.  Genitourinary: Positive for dysuria, frequency and hematuria. Negative for flank pain, vaginal bleeding and vaginal discharge.       Objective:   Physical Exam  Constitutional: She is oriented to person, place, and time. She appears well-developed and well-nourished.  HENT:  Head: Normocephalic and atraumatic.  Right Ear: External ear normal.  Left Ear: External ear normal.  Eyes: Conjunctivae are normal.  Neck: Normal range of motion.  Cardiovascular: Normal rate, regular rhythm and normal heart sounds.   Pulmonary/Chest: Effort normal and breath sounds normal.  Abdominal: There is no tenderness. There is no CVA tenderness.  Neurological: She is alert and oriented to person, place, and time.  Skin:     Psychiatric: She has a normal mood and affect. Her behavior is normal. Judgment and thought content normal.     Results for orders placed in visit on 01/11/14  POCT URINALYSIS DIPSTICK      Result Value Ref Range   Color, UA yellow     Clarity, UA clear     Glucose, UA neg     Bilirubin, UA neg     Ketones, UA neg     Spec Grav, UA 1.015     Blood, UA neg     pH, UA 6.5     Protein, UA neg     Urobilinogen, UA 0.2     Nitrite, UA neg      Leukocytes, UA Negative    POCT UA - MICROSCOPIC ONLY      Result Value Ref Range   WBC, Ur, HPF, POC 1-3     RBC, urine, microscopic 0-2     Bacteria, U Microscopic trace     Mucus, UA neg     Epithelial cells, urine per micros 1-3     Crystals, Ur, HPF, POC neg     Casts, Ur, LPF, POC neg     Yeast, UA neg    POCT SKIN KOH      Result Value Ref Range   Skin KOH, POC Negative          Assessment & Plan:  Frequency - Plan: POCT urinalysis dipstick, POCT UA - Microscopic Only  Burning with urination - Plan: POCT urinalysis dipstick, POCT UA - Microscopic Only, Urine culture, ciprofloxacin (CIPRO) 250 MG tablet  Rash and nonspecific skin eruption - Plan: POCT Skin KOH, ketoconazole (NIZORAL) 2 % cream  Will go ahead and treat for possible UTI with Cipro 250 mg bid x 5 days Urine culture pending Rash possible tinea - will treat with ketoconazole daily x 2 weeks. If no improvement after 2 weeks, trial of TAC.  Follow up if symptoms worsen or  fail to improve.

## 2014-01-14 ENCOUNTER — Ambulatory Visit: Payer: BC Managed Care – PPO | Admitting: Obstetrics & Gynecology

## 2014-01-14 LAB — URINE CULTURE: Colony Count: 55000

## 2014-01-18 ENCOUNTER — Encounter: Payer: Self-pay | Admitting: *Deleted

## 2014-08-23 ENCOUNTER — Encounter: Payer: Self-pay | Admitting: *Deleted

## 2014-08-24 ENCOUNTER — Encounter: Payer: Self-pay | Admitting: Obstetrics & Gynecology

## 2015-02-17 ENCOUNTER — Encounter (HOSPITAL_COMMUNITY): Payer: Self-pay | Admitting: *Deleted

## 2015-02-17 ENCOUNTER — Emergency Department (HOSPITAL_COMMUNITY): Payer: BLUE CROSS/BLUE SHIELD

## 2015-02-17 ENCOUNTER — Emergency Department (HOSPITAL_COMMUNITY)
Admission: EM | Admit: 2015-02-17 | Discharge: 2015-02-18 | Disposition: A | Discharge status: Home / Self Care | Payer: BLUE CROSS/BLUE SHIELD | Attending: Emergency Medicine | Admitting: Emergency Medicine

## 2015-02-17 DIAGNOSIS — Z862 Personal history of diseases of the blood and blood-forming organs and certain disorders involving the immune mechanism: Secondary | ICD-10-CM | POA: Diagnosis not present

## 2015-02-17 DIAGNOSIS — Z8619 Personal history of other infectious and parasitic diseases: Secondary | ICD-10-CM | POA: Insufficient documentation

## 2015-02-17 DIAGNOSIS — Z3490 Encounter for supervision of normal pregnancy, unspecified, unspecified trimester: Secondary | ICD-10-CM

## 2015-02-17 DIAGNOSIS — O209 Hemorrhage in early pregnancy, unspecified: Secondary | ICD-10-CM | POA: Diagnosis not present

## 2015-02-17 DIAGNOSIS — Z8719 Personal history of other diseases of the digestive system: Secondary | ICD-10-CM | POA: Diagnosis not present

## 2015-02-17 DIAGNOSIS — O4690 Antepartum hemorrhage, unspecified, unspecified trimester: Secondary | ICD-10-CM

## 2015-02-17 DIAGNOSIS — Z8742 Personal history of other diseases of the female genital tract: Secondary | ICD-10-CM | POA: Insufficient documentation

## 2015-02-17 DIAGNOSIS — Z8759 Personal history of other complications of pregnancy, childbirth and the puerperium: Secondary | ICD-10-CM

## 2015-02-17 DIAGNOSIS — Z3A09 9 weeks gestation of pregnancy: Secondary | ICD-10-CM | POA: Insufficient documentation

## 2015-02-17 DIAGNOSIS — Z87891 Personal history of nicotine dependence: Secondary | ICD-10-CM | POA: Diagnosis not present

## 2015-02-17 LAB — CBC
HEMATOCRIT: 35.4 % — AB (ref 36.0–46.0)
Hemoglobin: 12 g/dL (ref 12.0–15.0)
MCH: 27.3 pg (ref 26.0–34.0)
MCHC: 33.9 g/dL (ref 30.0–36.0)
MCV: 80.6 fL (ref 78.0–100.0)
Platelets: 260 10*3/uL (ref 150–400)
RBC: 4.39 MIL/uL (ref 3.87–5.11)
RDW: 13.4 % (ref 11.5–15.5)
WBC: 10.9 10*3/uL — ABNORMAL HIGH (ref 4.0–10.5)

## 2015-02-17 LAB — POC URINE PREG, ED: Preg Test, Ur: POSITIVE — AB

## 2015-02-17 LAB — HCG, QUANTITATIVE, PREGNANCY: hCG, Beta Chain, Quant, S: 183900 m[IU]/mL — ABNORMAL HIGH (ref <5)

## 2015-02-17 NOTE — ED Notes (Signed)
Pt reports she has had a positive pregnancy test, last menstrual cycle around April 12th.pt reports last Friday 6/17 she had bright red vaginal bleeding, since then has had brownish pink discharge with pressure and now starting to have abd pain 5/10. Pt had 2 miscarriages, 2 live births, and this is 5th pregnancy.

## 2015-02-17 NOTE — ED Provider Notes (Signed)
CSN: [id number]     Arrival date & time 02/17/15  1811 History   First MD Initiated Contact with Patient 02/17/15 2127     Chief Complaint  Patient presents with  . Vaginal Bleeding  . pregnancy      (Consider location/radiation/quality/duration/timing/severity/associated sxs/prior Treatment) Patient is a 29 y.o. female presenting with pregnancy problem. The history is provided by the patient. No language interpreter was used.  Possible Pregnancy This is a new problem. The current episode started today. The problem occurs constantly. The problem has been gradually worsening. Nothing aggravates the symptoms. She has tried nothing for the symptoms.  Pt reports she is pregnant and has vaginal bleeding. Pt  Sees Dr. Clearance Coots.  She has not started prenatal care.   Past Medical History  Diagnosis Date  . Endometriosis   . Sickle cell trait   . Heartburn in pregnancy   . GERD (gastroesophageal reflux disease) 2013    severe (per pt) heartburn- on meds but milk works better  . PONV (postoperative nausea and vomiting)   . Headache(784.0)     migraines  . Pregnancy induced hypertension     PIH with 1st baby  . Allergy   . Shingles    Past Surgical History  Procedure Laterality Date  . Cesarean section    . Laparoscopy    . Wisdom tooth extraction  2012  . Cesarean section  03/28/2012    Procedure: CESAREAN SECTION;  Surgeon: Antionette Char, MD;  Location: [facility];  Service: Gynecology;  Laterality: N/A;   Family History  Problem Relation Age of Onset  . Cancer Paternal Aunt   . Cancer Mother   . Hypertension Mother   . Hyperlipidemia Father   . Cancer Maternal Grandmother   . Hypertension Maternal Grandmother   . Cancer Maternal Grandfather   . Hyperlipidemia Maternal Grandfather   . Diabetes Paternal Grandmother   . Heart disease Paternal Grandfather    History  Substance Use Topics  . Smoking status: Former Games developer  . Smokeless tobacco: Never Used  . Alcohol Use: Yes    Comment: social   OB History    Gravida Para Term Preterm AB TAB SAB Ectopic Multiple Living   Review of Systems  Genitourinary: Positive for vaginal bleeding.  All other systems reviewed and are negative.     Allergies  Iodine and Shellfish allergy  Home Medications   Prior to Admission medications   Medication Sig Start Date End Date Taking? Authorizing Provider  ciprofloxacin (CIPRO) 250 MG tablet Take 1 tablet (250 mg total) by mouth 2 (two) times daily. Patient not taking: Reported on 02/17/2015 01/11/14   Nelva Nay, PA-C  etonogestrel-ethinyl estradiol (NUVARING) 0.12-0.015 MG/24HR vaginal ring Place 1 each vaginally every 28 (twenty-eight) days. Insert vaginally and leave in place for 3 consecutive weeks, then remove for 1 week. Patient not taking: Reported on 02/17/2015 11/09/13   Antionette Char, MD  ketoconazole (NIZORAL) 2 % cream Apply 1 application topically daily. Patient not taking: Reported on 02/17/2015 01/11/14   Geroge Baseman Marte, PA-C   BP 122/82 mmHg  Pulse 93  Temp(Src) 98.1 F (36.7 C)  SpO2 100% Physical Exam  Constitutional: She is oriented to person, place, and time. She appears well-developed and well-nourished.  HENT:  Head: Normocephalic and atraumatic.  Right Ear: External ear normal.  Left Ear: External ear normal.  Nose: Nose normal.  Mouth/Throat: Oropharynx is clear  and moist.  Eyes: EOM are normal.  Neck: Normal range of motion.  Cardiovascular: Normal rate.   Pulmonary/Chest: Effort normal.  Abdominal: She exhibits no distension.  Genitourinary:  Trace of light brown, probable dried blood  Musculoskeletal: Normal range of motion.  Neurological: She is alert and oriented to person, place, and time.  Skin: Skin is warm.  Psychiatric: She has a normal mood and affect.  Nursing note and vitals reviewed.   ED Course  Procedures (including critical care time) Labs Review Labs Reviewed  HCG, QUANTITATIVE,  PREGNANCY - Abnormal; Notable for the following:    hCG, Beta Chain, Quant, S [id number] (*)    All other components within normal limits  CBC - Abnormal; Notable for the following:    WBC 10.9 (*)    HCT 35.4 (*)    All other components within normal limits  POC URINE PREG, ED - Abnormal; Notable for the following:    Preg Test, Ur POSITIVE (*)    All other components within normal limits  ABO/RH    Imaging Review US Ob Comp Less 14 Wks  02/17/2015   CLINICAL DATA:  Patient with vaginal bleeding.  EXAM: OBSTETRIC <14 WK [place] AND TRANSVAGINAL OB US  TECHNIQUE: Both transabdominal and transvaginal ultrasound examinations were performed for complete evaluation of the gestation as well as the maternal uterus, adnexal regions, and pelvic cul-de-sac. Transvaginal technique was performed to assess early pregnancy.  COMPARISON:  None.  FINDINGS: Intrauterine gestational sac: Visualized/normal in shape.  Yolk sac:  Present  Embryo:  Present  Cardiac Activity: Present  Heart Rate: 178  bpm  CRL:  26  mm   9 w   2 d                  [place] EDC: 09/20/2015  Maternal uterus/adnexae: No subchorionic hemorrhage. The right and left ovaries are unremarkable. No free fluid in the pelvis.  IMPRESSION: Single live intrauterine gestation 9 weeks 2 days by crown-rump length.   Electronically Signed   By: Annia Belt M.D.   On: 02/17/2015 23:11   US Ob Transvaginal  02/17/2015   CLINICAL DATA:  Patient with vaginal bleeding.  EXAM: OBSTETRIC <14 WK [place] AND TRANSVAGINAL OB US  TECHNIQUE: Both transabdominal and transvaginal ultrasound examinations were performed for complete evaluation of the gestation as well as the maternal uterus, adnexal regions, and pelvic cul-de-sac. Transvaginal technique was performed to assess early pregnancy.  COMPARISON:  None.  FINDINGS: Intrauterine gestational sac: Visualized/normal in shape.  Yolk sac:  Present  Embryo:  Present  Cardiac Activity: Present  Heart Rate: 178  bpm  CRL:  26  mm   9 w   2  d                  [place] EDC: 09/20/2015  Maternal uterus/adnexae: No subchorionic hemorrhage. The right and left ovaries are unremarkable. No free fluid in the pelvis.  IMPRESSION: Single live intrauterine gestation 9 weeks 2 days by crown-rump length.   Electronically Signed   By: Annia Belt M.D.   On: 02/17/2015 23:11     EKG Interpretation None      MDM   Final diagnoses:  Pregnancy  Vaginal bleeding during pregnancy, antepartum    Prenatal vitamins Follow up with Dr. Clearance Coots for prenatal care.    Lonia Skinner Denton, PA-C 02/18/15 3729  Rolland Porter, MD 02/25/15 [phone]

## 2015-02-18 LAB — WET PREP, GENITAL
TRICH WET PREP: NONE SEEN
YEAST WET PREP: NONE SEEN

## 2015-02-18 LAB — GC/CHLAMYDIA PROBE AMP (~~LOC~~) NOT AT ARMC
CHLAMYDIA, DNA PROBE: NEGATIVE
Neisseria Gonorrhea: NEGATIVE

## 2015-02-18 LAB — ABO/RH: ABO/RH(D): O POS

## 2015-02-18 MED ORDER — PRENATAL COMPLETE 14-0.4 MG PO TABS: ORAL_TABLET | ORAL | Status: DC

## 2015-02-18 NOTE — Discharge Instructions (Signed)
Vaginal Bleeding During Pregnancy, First Trimester  A small amount of bleeding (spotting) from the vagina is relatively common in early pregnancy. It usually stops on its own. Various things may cause bleeding or spotting in early pregnancy. Some bleeding may be related to the pregnancy, and some may not. In most cases, the bleeding is normal and is not a problem. However, bleeding can also be a sign of something serious. Be sure to tell your health care provider about any vaginal bleeding right away.  Some possible causes of vaginal bleeding during the first trimester include:  · Infection or inflammation of the cervix.  · Growths (polyps) on the cervix.  · Miscarriage or threatened miscarriage.  · Pregnancy tissue has developed outside of the uterus and in a fallopian tube (tubal pregnancy).  · Tiny cysts have developed in the uterus instead of pregnancy tissue (molar pregnancy).  HOME CARE INSTRUCTIONS   Watch your condition for any changes. The following actions may help to lessen any discomfort you are feeling:  · Follow your health care provider's instructions for limiting your activity. If your health care provider orders bed rest, you may need to stay in bed and only get up to use the bathroom. However, your health care provider may allow you to continue light activity.  · If needed, make plans for someone to help with your regular activities and responsibilities while you are on bed rest.  · Keep track of the number of pads you use each day, how often you change pads, and how soaked (saturated) they are. Write this down.  · Do not use tampons. Do not douche.  · Do not have sexual intercourse or orgasms until approved by your health care provider.  · If you pass any tissue from your vagina, save the tissue so you can show it to your health care provider.  · Only take over-the-counter or prescription medicines as directed by your health care provider.  · Do not take aspirin because it can make you  bleed.  · Keep all follow-up appointments as directed by your health care provider.  SEEK MEDICAL CARE IF:  · You have any vaginal bleeding during any part of your pregnancy.  · You have cramps or labor pains.  · You have a fever, not controlled by medicine.  SEEK IMMEDIATE MEDICAL CARE IF:   · You have severe cramps in your back or belly (abdomen).  · You pass large clots or tissue from your vagina.  · Your bleeding increases.  · You feel light-headed or weak, or you have fainting episodes.  · You have chills.  · You are leaking fluid or have a gush of fluid from your vagina.  · You pass out while having a bowel movement.  MAKE SURE YOU:  · Understand these instructions.  · Will watch your condition.  · Will get help right away if you are not doing well or get worse.  Document Released: 05/23/2005 Document Revised: 08/18/2013 Document Reviewed: 04/20/2013  ExitCare® Patient Information ©2015 [employer]. This information is not intended to replace advice given to you by your health care provider. Make sure you discuss any questions you have with your health care provider.

## 2015-03-21 ENCOUNTER — Telehealth: Payer: Self-pay | Admitting: *Deleted

## 2015-03-21 DIAGNOSIS — Z30018 Encounter for initial prescription of other contraceptives: Secondary | ICD-10-CM

## 2015-03-21 MED ORDER — ETONOGESTREL-ETHINYL ESTRADIOL 0.12-0.015 MG/24HR VA RING: 1.0000 | VAGINAL_RING | VAGINAL | Status: DC

## 2015-03-21 NOTE — Telephone Encounter (Signed)
Patient called requesting a refill of her birth control. Patient did not mention recent pregnancy- but did discuss need for annual exam and plans for appointment. Told patient we could do short term birth control, but she did need to follow up for an annual exam with pap.

## 2015-04-07 ENCOUNTER — Ambulatory Visit: Payer: Self-pay | Admitting: Certified Nurse Midwife

## 2015-04-08 ENCOUNTER — Encounter: Payer: Self-pay | Admitting: Certified Nurse Midwife

## 2015-04-08 ENCOUNTER — Ambulatory Visit (INDEPENDENT_AMBULATORY_CARE_PROVIDER_SITE_OTHER): Payer: BLUE CROSS/BLUE SHIELD | Admitting: Certified Nurse Midwife

## 2015-04-08 VITALS — BP 126/83 | HR 90 | Temp 98.9°F | Ht 63.0 in | Wt 160.0 lb

## 2015-04-08 DIAGNOSIS — B373 Candidiasis of vulva and vagina: Secondary | ICD-10-CM

## 2015-04-08 DIAGNOSIS — B9689 Other specified bacterial agents as the cause of diseases classified elsewhere: Secondary | ICD-10-CM

## 2015-04-08 DIAGNOSIS — Z800 Family history of malignant neoplasm of digestive organs: Secondary | ICD-10-CM | POA: Diagnosis not present

## 2015-04-08 DIAGNOSIS — A499 Bacterial infection, unspecified: Secondary | ICD-10-CM | POA: Diagnosis not present

## 2015-04-08 DIAGNOSIS — N760 Acute vaginitis: Secondary | ICD-10-CM | POA: Diagnosis not present

## 2015-04-08 DIAGNOSIS — N803 Endometriosis of pelvic peritoneum: Secondary | ICD-10-CM

## 2015-04-08 DIAGNOSIS — Z113 Encounter for screening for infections with a predominantly sexual mode of transmission: Secondary | ICD-10-CM | POA: Diagnosis not present

## 2015-04-08 DIAGNOSIS — Z01419 Encounter for gynecological examination (general) (routine) without abnormal findings: Secondary | ICD-10-CM

## 2015-04-08 DIAGNOSIS — IMO0002 Reserved for concepts with insufficient information to code with codable children: Secondary | ICD-10-CM | POA: Insufficient documentation

## 2015-04-08 DIAGNOSIS — B3731 Acute candidiasis of vulva and vagina: Secondary | ICD-10-CM

## 2015-04-08 LAB — CBC WITH DIFFERENTIAL/PLATELET
Basophils Absolute: 0 K/uL (ref 0.0–0.1)
Basophils Relative: 0 % (ref 0–1)
Eosinophils Absolute: 0.1 K/uL (ref 0.0–0.7)
Eosinophils Relative: 1 % (ref 0–5)
HCT: 37.1 % (ref 36.0–46.0)
Hemoglobin: 12.5 g/dL (ref 12.0–15.0)
Lymphocytes Relative: 24 % (ref 12–46)
Lymphs Abs: 2.6 K/uL (ref 0.7–4.0)
MCH: 28.1 pg (ref 26.0–34.0)
MCHC: 33.7 g/dL (ref 30.0–36.0)
MCV: 83.4 fL (ref 78.0–100.0)
MPV: 10.3 fL (ref 8.6–12.4)
Monocytes Absolute: 0.7 K/uL (ref 0.1–1.0)
Monocytes Relative: 6 % (ref 3–12)
Neutro Abs: 7.6 K/uL (ref 1.7–7.7)
Neutrophils Relative %: 69 % (ref 43–77)
Platelets: 306 K/uL (ref 150–400)
RBC: 4.45 MIL/uL (ref 3.87–5.11)
RDW: 13.7 % (ref 11.5–15.5)
WBC: 11 K/uL — ABNORMAL HIGH (ref 4.0–10.5)

## 2015-04-08 LAB — TSH: TSH: 1.027 u[IU]/mL (ref 0.350–4.500)

## 2015-04-08 MED ORDER — ETONOGESTREL-ETHINYL ESTRADIOL 0.12-0.015 MG/24HR VA RING: 1.0000 | VAGINAL_RING | VAGINAL | Status: DC

## 2015-04-08 MED ORDER — METRONIDAZOLE 500 MG PO TABS: 500.0000 mg | ORAL_TABLET | Freq: Two times a day (BID) | ORAL | Status: DC

## 2015-04-08 MED ORDER — FLUCONAZOLE 100 MG PO TABS: 100.0000 mg | ORAL_TABLET | Freq: Once | ORAL | Status: DC

## 2015-04-08 NOTE — Progress Notes (Signed)
Patient ID: Tammy Jordan, female   DOB: Apr 21, 1986, 29 y.o.   MRN: [id number]    Subjective:      Tammy Jordan is a 29 y.o. female here for a routine exam.  Current complaints: miscarriage in early June, denies any problems since.  Is having vaginal odor.   Menses lasting 5-7 days with clots and dysmenorrhea.  Hx of endometriosis with 2 laparoscopies.    Personal health questionnaire:  Is patient Ashkenazi Jewish, have a family history of breast and/or ovarian cancer: yes, 2 aunts over 41 years of age with BCA, one aunt with multiple types of cancer Is there a family history of uterine cancer diagnosed at age < 51, gastrointestinal cancer, urinary tract cancer, family member who is a Personnel officer syndrome-associated carrier: yes,  Is the patient overweight and hypertensive, family history of diabetes, personal history of gestational diabetes, preeclampsia or PCOS: no Is patient over 16, have PCOS,  family history of premature CHD under age 2, diabetes, smoke, have hypertension or peripheral artery disease:  yes At any time, has a partner hit, kicked or otherwise hurt or frightened you?: no Over the past 2 weeks, have you felt down, depressed or hopeless?: no Over the past 2 weeks, have you felt little interest or pleasure in doing things?:no   Gynecologic History Patient's last menstrual period was 03/17/2015 (approximate). Contraception: NuvaRing vaginal inserts Last Pap: 10/26/13. Results were: normal Last mammogram: N/A.   Obstetric History OB History  Gravida Para Term Preterm AB SAB TAB Ectopic Multiple Living  5 2 2  2 2    2     # Outcome Date GA Lbr Len/2nd Weight Sex Delivery Anes PTL Lv  5 Gravida           4 Term 03/28/12 [redacted]w[redacted]d  7 lb 14.8 oz (3.595 kg) M CS-Vac Spinal  Y  3 SAB           2 SAB           1 Term               Past Medical History  Diagnosis Date  . Endometriosis   . Sickle cell trait   . Heartburn in pregnancy   . GERD (gastroesophageal reflux disease)  2013    severe (per pt) heartburn- on meds but milk works better  . PONV (postoperative nausea and vomiting)   . Headache(784.0)     migraines  . Pregnancy induced hypertension     PIH with 1st baby  . Allergy   . Shingles     Past Surgical History  Procedure Laterality Date  . Cesarean section    . Laparoscopy    . Wisdom tooth extraction  2012  . Cesarean section  03/28/2012    Procedure: CESAREAN SECTION;  Surgeon: Antionette Char, MD;  Location: [facility];  Service: Gynecology;  Laterality: N/A;     Current outpatient prescriptions:  .  ciprofloxacin (CIPRO) 250 MG tablet, Take 1 tablet (250 mg total) by mouth 2 (two) times daily. (Patient not taking: Reported on 02/17/2015), Disp: 10 tablet, Rfl: 0 .  etonogestrel-ethinyl estradiol (NUVARING) 0.12-0.015 MG/24HR vaginal ring, Place 1 each vaginally every 21 ( twenty-one) days. Insert 1 ring vaginally and leave in place for 3 weeks, then remove for 1 week., Disp: 1 each, Rfl: 11 .  fluconazole (DIFLUCAN) 100 MG tablet, Take 1 tablet (100 mg total) by mouth once. Repeat dose in 48-72 hour., Disp: 3 tablet, Rfl: 0 .  ketoconazole (NIZORAL) 2 % cream, Apply 1 application topically daily. (Patient not taking: Reported on 02/17/2015), Disp: 30 g, Rfl: 0 .  metroNIDAZOLE (FLAGYL) 500 MG tablet, Take 1 tablet (500 mg total) by mouth 2 (two) times daily., Disp: 14 tablet, Rfl: 0 .  Prenatal Vit-Fe Fumarate-FA (PRENATAL COMPLETE) 14-0.4 MG TABS, One a day (Patient not taking: Reported on 04/08/2015), Disp: 60 each, Rfl: 2 Allergies  Allergen Reactions  . Iodine Anaphylaxis  . Shellfish Allergy Swelling    Social History  Substance Use Topics  . Smoking status: Former Games developer  . Smokeless tobacco: Never Used  . Alcohol Use: Yes     Comment: social    Family History  Problem Relation Age of Onset  . Cancer Paternal Aunt   . Cancer Mother   . Hypertension Mother   . Hyperlipidemia Father   . Cancer Father   . Cancer Maternal Grandmother    . Hypertension Maternal Grandmother   . Cancer Maternal Grandfather   . Hyperlipidemia Maternal Grandfather   . Diabetes Paternal Grandmother   . Heart disease Paternal Grandfather       Review of Systems  Constitutional: negative for fatigue and weight loss Respiratory: negative for cough and wheezing Cardiovascular: negative for chest pain, fatigue and palpitations Gastrointestinal: negative for abdominal pain and change in bowel habits Musculoskeletal:negative for myalgias Neurological: negative for gait problems and tremors Behavioral/Psych: negative for abusive relationship, depression Endocrine: negative for temperature intolerance   Genitourinary:negative for abnormal menstrual periods, genital lesions, hot flashes, sexual problems and vaginal discharge Integument/breast: negative for breast lump, breast tenderness, nipple discharge and skin lesion(s)    Objective:       BP 126/83 mmHg  Pulse 90  Temp(Src) 98.9 F (37.2 C)  Ht 5\' 3"  (1.6 m)  Wt 160 lb (72.576 kg)  BMI 28.35 kg/m2  LMP 03/17/2015 (Approximate)  Breastfeeding? Unknown General:   alert  Skin:   no rash or abnormalities  Lungs:   clear to auscultation bilaterally  Heart:   regular rate and rhythm, S1, S2 normal, no murmur, click, rub or gallop  Breasts:   normal without suspicious masses, skin or nipple changes or axillary nodes  Abdomen:  normal findings: no organomegaly, soft, non-tender and no hernia  Pelvis:  External genitalia: normal general appearance Urinary system: urethral meatus normal and bladder without fullness, nontender Vaginal: normal without tenderness, induration or masses.  + thin gray  vaginal discharge Cervix: normal appearance Adnexa: normal bimanual exam Uterus: anteverted and non-tender, normal size   Lab Review Urine pregnancy test Labs reviewed yes Radiologic studies reviewed no  50% of 30 min visit spent on counseling and coordination of care.   Assessment:     Healthy female exam.   Contraception management Recent SAB BV STD screening Multiple types of CA in the family AUB/Dysmenorrhea Hx of endometriosis   Plan:    Education reviewed: calcium supplements, depression evaluation, low fat, low cholesterol diet, safe sex/STD prevention, self breast exams and skin cancer screening. Contraception: NuvaRing vaginal inserts. Follow up in: 1 year.   Meds ordered this encounter  Medications  . etonogestrel-ethinyl estradiol (NUVARING) 0.12-0.015 MG/24HR vaginal ring    Sig: Place 1 each vaginally every 21 ( twenty-one) days. Insert 1 ring vaginally and leave in place for 3 weeks, then remove for 1 week.    Dispense:  1 each    Refill:  11  . fluconazole (DIFLUCAN) 100 MG tablet    Sig: Take 1 tablet (100 mg  total) by mouth once. Repeat dose in 48-72 hour.    Dispense:  3 tablet    Refill:  0  . metroNIDAZOLE (FLAGYL) 500 MG tablet    Sig: Take 1 tablet (500 mg total) by mouth 2 (two) times daily.    Dispense:  14 tablet    Refill:  0   Orders Placed This Encounter  Procedures  . SureSwab, Vaginosis/Vaginitis Plus  . HIV antibody (with reflex)  . Hepatitis B surface antigen  . RPR  . Hepatitis C antibody  . TSH  . CBC with Differential/Platelet  . Comprehensive metabolic panel  . Cholesterol, total  . Triglycerides  . HDL cholesterol  . Ambulatory referral to Genetics    Referral Priority:  Routine    Referral Type:  Consultation    Referral Reason:  Specialty Services Required    Number of Visits Requested:  1

## 2015-04-09 LAB — RPR

## 2015-04-09 LAB — TRIGLYCERIDES: Triglycerides: 96 mg/dL (ref <150)

## 2015-04-09 LAB — COMPREHENSIVE METABOLIC PANEL
ALK PHOS: 57 U/L (ref 33–115)
ALT: 9 U/L (ref 6–29)
AST: 11 U/L (ref 10–30)
Albumin: 3.9 g/dL (ref 3.6–5.1)
BUN: 12 mg/dL (ref 7–25)
CO2: 22 mmol/L (ref 20–31)
CREATININE: 0.9 mg/dL (ref 0.50–1.10)
Calcium: 8.8 mg/dL (ref 8.6–10.2)
Chloride: 105 mmol/L (ref 98–110)
GLUCOSE: 79 mg/dL (ref 65–99)
POTASSIUM: 4.1 mmol/L (ref 3.5–5.3)
Sodium: 139 mmol/L (ref 135–146)
Total Bilirubin: 0.5 mg/dL (ref 0.2–1.2)
Total Protein: 6.8 g/dL (ref 6.1–8.1)

## 2015-04-09 LAB — HIV ANTIBODY (ROUTINE TESTING W REFLEX): HIV: NONREACTIVE

## 2015-04-09 LAB — HEPATITIS B SURFACE ANTIGEN: HEP B S AG: NEGATIVE

## 2015-04-09 LAB — CHOLESTEROL, TOTAL: Cholesterol: 168 mg/dL (ref 125–200)

## 2015-04-09 LAB — HDL CHOLESTEROL: HDL: 61 mg/dL (ref >=46)

## 2015-04-09 LAB — HEPATITIS C ANTIBODY: HCV AB: NEGATIVE

## 2015-04-13 LAB — PAP, TP IMAGING W/ HPV RNA, RFLX HPV TYPE 16,18/45: HPV mRNA, High Risk: NOT DETECTED

## 2015-04-14 ENCOUNTER — Other Ambulatory Visit: Payer: Self-pay | Admitting: Certified Nurse Midwife

## 2015-04-14 LAB — SURESWAB, VAGINOSIS/VAGINITIS PLUS
ATOPOBIUM VAGINAE: NOT DETECTED Log (cells/mL)
C. PARAPSILOSIS, DNA: NOT DETECTED
C. albicans, DNA: DETECTED — AB
C. glabrata, DNA: NOT DETECTED
C. trachomatis RNA, TMA: NOT DETECTED
C. tropicalis, DNA: NOT DETECTED
Gardnerella vaginalis: NOT DETECTED Log (cells/mL)
LACTOBACILLUS SPECIES: 6.9 Log (cells/mL)
MEGASPHAERA SPECIES: NOT DETECTED Log (cells/mL)
N. GONORRHOEAE RNA, TMA: NOT DETECTED
T. VAGINALIS RNA, QL TMA: NOT DETECTED

## 2015-04-27 ENCOUNTER — Telehealth: Payer: Self-pay | Admitting: Genetic Counselor

## 2015-04-27 NOTE — Telephone Encounter (Signed)
Second call made regarding gen counseling appt.  Left message.

## 2015-07-15 ENCOUNTER — Ambulatory Visit (INDEPENDENT_AMBULATORY_CARE_PROVIDER_SITE_OTHER): Payer: BLUE CROSS/BLUE SHIELD | Admitting: Family Medicine

## 2015-07-15 VITALS — BP 130/86 | HR 107 | Temp 99.4°F | Resp 18 | Ht 63.0 in | Wt 162.0 lb

## 2015-07-15 DIAGNOSIS — R350 Frequency of micturition: Secondary | ICD-10-CM | POA: Diagnosis not present

## 2015-07-15 DIAGNOSIS — Z0000 Encounter for general adult medical examination without abnormal findings: Secondary | ICD-10-CM

## 2015-07-15 LAB — POCT URINALYSIS DIP (MANUAL ENTRY)
BILIRUBIN UA: NEGATIVE
Glucose, UA: NEGATIVE
Ketones, POC UA: NEGATIVE
Nitrite, UA: NEGATIVE
Protein Ur, POC: NEGATIVE
RBC UA: NEGATIVE
SPEC GRAV UA: 1.015
Urobilinogen, UA: 0.2
pH, UA: 7

## 2015-07-15 LAB — POC MICROSCOPIC URINALYSIS (UMFC): Mucus: ABSENT

## 2015-07-15 NOTE — Progress Notes (Addendum)
Subjective:    Patient ID: Tammy Jordan, female    DOB: October 23, 1985, 29 y.o.   MRN: [id number] This chart was scribed for Norberto Sorenson, MD by Littie Deeds, Medical Scribe. This patient was seen in Room 10 and the patient's care was started at 6:21 PM.   Chief Complaint  Patient presents with  . Annual Exam    HPI HPI Comments: Tammy Jordan is a 29 y.o. female who presents to the [facility] for a complete physical exam. No specific concerns today. She does not measure her blood pressure outside of the office and notes that she did have elevated blood pressure during pregnancy. Her FMHx does include hypertension.  Well woman care: Patient is followed by gynecology. She does have a history of abnormal pap tests, but her last pap test was normal. She had STD testing done when she was at her gynecologist's office 3 months ago.  Vitamins/supplements: She takes a multivitamin. She does not take calcium supplements nor eat much dairy, but she does eat greens and drink orange juice.  Patient states she had some discomfort and felt as if she was about to develop a UTI last week; however, she drank a lot of water and her symptoms resolved.  Patient has an 80 year old daughter, a 30-year-old son, and a 64-year-old son (present today).  Past Medical History  Diagnosis Date  . Endometriosis   . Sickle cell trait (HCC)   . Heartburn in pregnancy   . GERD (gastroesophageal reflux disease) 2013    severe (per pt) heartburn- on meds but milk works better  . PONV (postoperative nausea and vomiting)   . Headache(784.0)     migraines  . Pregnancy induced hypertension     PIH with 1st baby  . Allergy   . Shingles    Past Surgical History  Procedure Laterality Date  . Cesarean section    . Laparoscopy    . Wisdom tooth extraction  2012  . Cesarean section  03/28/2012    Procedure: CESAREAN SECTION;  Surgeon: Antionette Char, MD;  Location: [facility];  Service: Gynecology;   Laterality: N/A;   Family History  Problem Relation Age of Onset  . Cancer Paternal Aunt   . Cancer Mother   . Hypertension Mother   . Hyperlipidemia Father   . Cancer Father   . Cancer Maternal Grandmother   . Hypertension Maternal Grandmother   . Cancer Maternal Grandfather   . Hyperlipidemia Maternal Grandfather   . Diabetes Paternal Grandmother   . Heart disease Paternal Grandfather     Social History   Social History  . Marital Status: Legally Separated    Spouse Name: N/A  . Number of Children: N/A  . Years of Education: N/A   Occupational History  . Not on file.   Social History Main Topics  . Smoking status: Former Games developer  . Smokeless tobacco: Never Used  . Alcohol Use: Yes     Comment: social  . Drug Use: No  . Sexual Activity: Yes    Birth Control/ Protection: Inserts   Other Topics Concern  . Not on file   Social History Narrative    Review of Systems  Neurological: Positive for headaches.   13 point ROS reviewed on patient health survey. Negative other than listed above or in nursing note. See nursing note.     Objective:  BP 130/86 mmHg  Pulse 107  Temp(Src) 99.4 F (37.4 C) (Oral)  Resp 18  Ht 5\' 3"  (1.6 m)  Wt 162 lb (73.483 kg)  BMI 28.70 kg/m2  SpO2 96%  LMP 06/17/2015  Physical Exam  Constitutional: She is oriented to person, place, and time. She appears well-developed and well-nourished. No distress.  HENT:  Head: Normocephalic and atraumatic.  Right Ear: Tympanic membrane, external ear and ear canal normal.  Left Ear: Tympanic membrane, external ear and ear canal normal.  Nose: Nose normal. No mucosal edema or rhinorrhea.  Mouth/Throat: Uvula is midline, oropharynx is clear and moist and mucous membranes are normal. No oropharyngeal exudate or posterior oropharyngeal erythema.  Eyes: Conjunctivae and EOM are normal. Pupils are equal, round, and reactive to light. Right eye exhibits no discharge. Left eye exhibits no discharge. No  scleral icterus.  Neck: Normal range of motion. Neck supple. No thyromegaly present.  Cardiovascular: Normal rate, regular rhythm, S1 normal, S2 normal, normal heart sounds and intact distal pulses.   No murmur heard. Repeat blood pressure: 130/84.   Pulmonary/Chest: Effort normal and breath sounds normal. No respiratory distress. She has no wheezes. She has no rales.  Abdominal: Soft. Bowel sounds are normal. She exhibits no distension. There is no tenderness.  Musculoskeletal: She exhibits no edema.  Lymphadenopathy:    She has no cervical adenopathy.  Neurological: She is alert and oriented to person, place, and time. She displays no tremor. Gait normal.  CN grossly intact  Skin: Skin is warm and dry. No rash noted. She is not diaphoretic. No erythema.  Psychiatric: She has a normal mood and affect. Her behavior is normal.  Nursing note and vitals reviewed.     Results for orders placed or performed in visit on 07/15/15  POCT urinalysis dipstick  Result Value Ref Range   Color, UA yellow yellow   Clarity, UA cloudy (A) clear   Glucose, UA negative negative   Bilirubin, UA negative negative   Ketones, POC UA negative negative   Spec Grav, UA 1.015    Blood, UA negative negative   pH, UA 7.0    Protein Ur, POC negative negative   Urobilinogen, UA 0.2    Nitrite, UA Negative Negative   Leukocytes, UA Trace (A) Negative  POCT Microscopic Urinalysis ([facility])  Result Value Ref Range   WBC,UR,HPF,POC Few (A) None WBC/hpf   RBC,UR,HPF,POC None None RBC/hpf   Bacteria Few (A) None, Too numerous to count   Mucus Absent Absent   Epithelial Cells, UR Per Microscopy Few (A) None, Too numerous to count cells/hpf    Assessment & Plan:   1. Annual physical exam   2. Urinary frequency     Orders Placed This Encounter  Procedures  . Urine culture  . POCT urinalysis dipstick  . POCT Microscopic Urinalysis ([facility])    Meds ordered this encounter  Medications  . Multiple  Vitamins-Minerals (MULTIVITAMIN WITH MINERALS) tablet    Sig: Take 1 tablet by mouth daily.    I personally performed the services described in this documentation, which was scribed in my presence. The recorded information has been reviewed and considered, and addended by me as needed.  Norberto SorensonEva Damari Hiltz, MD MPH   By signing my name below, I, Littie Deedsichard Sun, attest that this documentation has been prepared under the direction and in the presence of Norberto SorensonEva Tamarion Haymond, MD.  Electronically Signed: Littie Deedsichard Sun, Medical Scribe. 07/15/2015. 6:21 PM.

## 2015-07-15 NOTE — Patient Instructions (Signed)
DASH Eating Plan DASH stands for "Dietary Approaches to Stop Hypertension." The DASH eating plan is a healthy eating plan that has been shown to reduce high blood pressure (hypertension). Additional health benefits may include reducing the risk of type 2 diabetes mellitus, heart disease, and stroke. The DASH eating plan may also help with weight loss. WHAT DO I NEED TO KNOW ABOUT THE DASH EATING PLAN? For the DASH eating plan, you will follow these general guidelines:  Choose foods with a percent daily value for sodium of less than 5% (as listed on the food label).  Use salt-free seasonings or herbs instead of table salt or sea salt.  Check with your health care provider or pharmacist before using salt substitutes.  Eat lower-sodium products, often labeled as "lower sodium" or "no salt added."  Eat fresh foods.  Eat more vegetables, fruits, and low-fat dairy products.  Choose whole grains. Look for the word "whole" as the first word in the ingredient list.  Choose fish and skinless chicken or [place] more often than red meat. Limit fish, poultry, and meat to 6 oz (170 g) each day.  Limit sweets, desserts, sugars, and sugary drinks.  Choose heart-healthy fats.  Limit cheese to 1 oz (28 g) per day.  Eat more home-cooked food and less restaurant, buffet, and fast food.  Limit fried foods.  Cook foods using methods other than frying.  Limit canned vegetables. If you do use them, rinse them well to decrease the sodium.  When eating at a restaurant, ask that your food be prepared with less salt, or no salt if possible. WHAT FOODS CAN I EAT? Seek help from a dietitian for individual calorie needs. Grains Whole grain or whole wheat bread. Brown rice. Whole grain or whole wheat pasta. Quinoa, bulgur, and whole grain cereals. Low-sodium cereals. Corn or whole wheat flour tortillas. Whole grain cornbread. Whole grain crackers. Low-sodium crackers. Vegetables Fresh or frozen vegetables  (raw, steamed, roasted, or grilled). Low-sodium or reduced-sodium tomato and vegetable juices. Low-sodium or reduced-sodium tomato sauce and paste. Low-sodium or reduced-sodium canned vegetables.  Fruits All fresh, canned (in natural juice), or frozen fruits. Meat and Other Protein Products Ground beef (85% or leaner), grass-fed beef, or beef trimmed of fat. Skinless chicken or [place]. Ground chicken or [place]. Pork trimmed of fat. All fish and seafood. Eggs. Dried beans, peas, or lentils. Unsalted nuts and seeds. Unsalted canned beans. Dairy Low-fat dairy products, such as skim or 1% milk, 2% or reduced-fat cheeses, low-fat ricotta or cottage cheese, or plain low-fat yogurt. Low-sodium or reduced-sodium cheeses. Fats and Oils Tub margarines without trans fats. Light or reduced-fat mayonnaise and salad dressings (reduced sodium). Avocado. Safflower, olive, or canola oils. Natural peanut or almond butter. Other Unsalted popcorn and pretzels. The items listed above may not be a complete list of recommended foods or beverages. Contact your dietitian for more options. WHAT FOODS ARE NOT RECOMMENDED? Grains White bread. White pasta. White rice. Refined cornbread. Bagels and croissants. Crackers that contain trans fat. Vegetables Creamed or fried vegetables. Vegetables in a cheese sauce. Regular canned vegetables. Regular canned tomato sauce and paste. Regular tomato and vegetable juices. Fruits Dried fruits. Canned fruit in light or heavy syrup. Fruit juice. Meat and Other Protein Products Fatty cuts of meat. Ribs, chicken wings, bacon, sausage, bologna, salami, chitterlings, fatback, hot dogs, bratwurst, and packaged luncheon meats. Salted nuts and seeds. Canned beans with salt. Dairy Whole or 2% milk, cream, half-and-half, and cream cheese. Whole-fat or sweetened yogurt. Full-fat  cheeses or blue cheese. Nondairy creamers and whipped toppings. Processed cheese, cheese spreads, or cheese  curds. Condiments Onion and garlic salt, seasoned salt, table salt, and sea salt. Canned and packaged gravies. Worcestershire sauce. Tartar sauce. Barbecue sauce. Teriyaki sauce. Soy sauce, including reduced sodium. Steak sauce. Fish sauce. Oyster sauce. Cocktail sauce. Horseradish. Ketchup and mustard. Meat flavorings and tenderizers. Bouillon cubes. Hot sauce. Tabasco sauce. Marinades. Taco seasonings. Relishes. Fats and Oils Butter, stick margarine, lard, shortening, ghee, and bacon fat. Coconut, palm kernel, or palm oils. Regular salad dressings. Other Pickles and olives. Salted popcorn and pretzels. The items listed above may not be a complete list of foods and beverages to avoid. Contact your dietitian for more information. WHERE CAN I FIND MORE INFORMATION? National Heart, Lung, and Blood Institute: travelstabloid.com   This information is not intended to replace advice given to you by your health care provider. Make sure you discuss any questions you have with your health care provider.   Document Released: 08/02/2011 Document Revised: 09/03/2014 Document Reviewed: 06/17/2013 Elsevier Interactive Patient Education 2016 [place] Maintenance, Female Adopting a healthy lifestyle and getting preventive care can go a long way to promote health and wellness. Talk with your health care provider about what schedule of regular examinations is right for you. This is a good chance for you to check in with your provider about disease prevention and staying healthy. In between checkups, there are plenty of things you can do on your own. Experts have done a lot of research about which lifestyle changes and preventive measures are most likely to keep you healthy. Ask your health care provider for more information. WEIGHT AND DIET  Eat a healthy diet  Be sure to include plenty of vegetables, fruits, low-fat dairy products, and lean protein.  Do not eat a  lot of foods high in solid fats, added sugars, or salt.  Get regular exercise. This is one of the most important things you can do for your health.  Most adults should exercise for at least 150 minutes each week. The exercise should increase your heart rate and make you sweat (moderate-intensity exercise).  Most adults should also do strengthening exercises at least twice a week. This is in addition to the moderate-intensity exercise.  Maintain a healthy weight  Body mass index (BMI) is a measurement that can be used to identify possible weight problems. It estimates body fat based on height and weight. Your health care provider can help determine your BMI and help you achieve or maintain a healthy weight.  For females 47 years of age and older:   A BMI below 18.5 is considered underweight.  A BMI of 18.5 to 24.9 is normal.  A BMI of 25 to 29.9 is considered overweight.  A BMI of 30 and above is considered obese.  Watch levels of cholesterol and blood lipids  You should start having your blood tested for lipids and cholesterol at 31 years of age, then have this test every 5 years.  You may need to have your cholesterol levels checked more often if:  Your lipid or cholesterol levels are high.  You are older than 31 years of age.  You are at high risk for heart disease.  CANCER SCREENING   Lung Cancer  Lung cancer screening is recommended for adults 27-37 years old who are at high risk for lung cancer because of a history of smoking.  A yearly low-dose CT scan of the lungs is recommended for  people who:  Currently smoke.  Have quit within the past 15 years.  Have at least a 30-pack-year history of smoking. A pack year is smoking an average of one pack of cigarettes a day for 1 year.  Yearly screening should continue until it has been 15 years since you quit.  Yearly screening should stop if you develop a health problem that would prevent you from having lung cancer  treatment.  Breast Cancer  Practice breast self-awareness. This means understanding how your breasts normally appear and feel.  It also means doing regular breast self-exams. Let your health care provider know about any changes, no matter how small.  If you are in your 20s or 30s, you should have a clinical breast exam (CBE) by a health care provider every 1-3 years as part of a regular health exam.  If you are 67 or older, have a CBE every year. Also consider having a breast X-ray (mammogram) every year.  If you have a family history of breast cancer, talk to your health care provider about genetic screening.  If you are at high risk for breast cancer, talk to your health care provider about having an MRI and a mammogram every year.  Breast cancer gene (BRCA) assessment is recommended for women who have family members with BRCA-related cancers. BRCA-related cancers include:  Breast.  Ovarian.  Tubal.  Peritoneal cancers.  Results of the assessment will determine the need for genetic counseling and BRCA1 and BRCA2 testing. Cervical Cancer Your health care provider may recommend that you be screened regularly for cancer of the pelvic organs (ovaries, uterus, and vagina). This screening involves a pelvic examination, including checking for microscopic changes to the surface of your cervix (Pap test). You may be encouraged to have this screening done every 3 years, beginning at age 65.  For women ages 59-65, health care providers may recommend pelvic exams and Pap testing every 3 years, or they may recommend the Pap and pelvic exam, combined with testing for human papilloma virus (HPV), every 5 years. Some types of HPV increase your risk of cervical cancer. Testing for HPV may also be done on women of any age with unclear Pap test results.  Other health care providers may not recommend any screening for nonpregnant women who are considered low risk for pelvic cancer and who do not have  symptoms. Ask your health care provider if a screening pelvic exam is right for you.  If you have had past treatment for cervical cancer or a condition that could lead to cancer, you need Pap tests and screening for cancer for at least 20 years after your treatment. If Pap tests have been discontinued, your risk factors (such as having a new sexual partner) need to be reassessed to determine if screening should resume. Some women have medical problems that increase the chance of getting cervical cancer. In these cases, your health care provider may recommend more frequent screening and Pap tests. Colorectal Cancer  This type of cancer can be detected and often prevented.  Routine colorectal cancer screening usually begins at 31 years of age and continues through 31 years of age.  Your health care provider may recommend screening at an earlier age if you have risk factors for colon cancer.  Your health care provider may also recommend using home test kits to check for hidden blood in the stool.  A small camera at the end of a tube can be used to examine your colon directly (sigmoidoscopy or  colonoscopy). This is done to check for the earliest forms of colorectal cancer.  Routine screening usually begins at age 49.  Direct examination of the colon should be repeated every 5-10 years through 31 years of age. However, you may need to be screened more often if early forms of precancerous polyps or small growths are found. Skin Cancer  Check your skin from head to toe regularly.  Tell your health care provider about any new moles or changes in moles, especially if there is a change in a mole's shape or color.  Also tell your health care provider if you have a mole that is larger than the size of a pencil eraser.  Always use sunscreen. Apply sunscreen liberally and repeatedly throughout the day.  Protect yourself by wearing long sleeves, pants, a wide-brimmed hat, and sunglasses whenever you are  outside. HEART DISEASE, DIABETES, AND HIGH BLOOD PRESSURE   High blood pressure causes heart disease and increases the risk of stroke. High blood pressure is more likely to develop in:  People who have blood pressure in the high end of the normal range (130-139/85-89 mm Hg).  People who are overweight or obese.  People who are African American.  If you are 32-50 years of age, have your blood pressure checked every 3-5 years. If you are 4 years of age or older, have your blood pressure checked every year. You should have your blood pressure measured twice--once when you are at a hospital or clinic, and once when you are not at a hospital or clinic. Record the average of the two measurements. To check your blood pressure when you are not at a hospital or clinic, you can use:  An automated blood pressure machine at a pharmacy.  A home blood pressure monitor.  If you are between 54 years and 47 years old, ask your health care provider if you should take aspirin to prevent strokes.  Have regular diabetes screenings. This involves taking a blood sample to check your fasting blood sugar level.  If you are at a normal weight and have a low risk for diabetes, have this test once every three years after 31 years of age.  If you are overweight and have a high risk for diabetes, consider being tested at a younger age or more often. PREVENTING INFECTION  Hepatitis B  If you have a higher risk for hepatitis B, you should be screened for this virus. You are considered at high risk for hepatitis B if:  You were born in a country where hepatitis B is common. Ask your health care provider which countries are considered high risk.  Your parents were born in a high-risk country, and you have not been immunized against hepatitis B (hepatitis B vaccine).  You have HIV or AIDS.  You use needles to inject street drugs.  You live with someone who has hepatitis B.  You have had sex with someone who has  hepatitis B.  You get hemodialysis treatment.  You take certain medicines for conditions, including cancer, organ transplantation, and autoimmune conditions. Hepatitis C  Blood testing is recommended for:  Everyone born from [birth year] through 1967.  Anyone with known risk factors for hepatitis C. Sexually transmitted infections (STIs)  You should be screened for sexually transmitted infections (STIs) including gonorrhea and chlamydia if:  You are sexually active and are younger than 30 years of age.  You are older than 31 years of age and your health care provider tells you that you  are at risk for this type of infection.  Your sexual activity has changed since you were last screened and you are at an increased risk for chlamydia or gonorrhea. Ask your health care provider if you are at risk.  If you do not have HIV, but are at risk, it may be recommended that you take a prescription medicine daily to prevent HIV infection. This is called pre-exposure prophylaxis (PrEP). You are considered at risk if:  You are sexually active and do not regularly use condoms or know the HIV status of your partner(s).  You take drugs by injection.  You are sexually active with a partner who has HIV. Talk with your health care provider about whether you are at high risk of being infected with HIV. If you choose to begin PrEP, you should first be tested for HIV. You should then be tested every 3 months for as long as you are taking PrEP.  PREGNANCY   If you are premenopausal and you may become pregnant, ask your health care provider about preconception counseling.  If you may become pregnant, take 400 to 800 micrograms (mcg) of folic acid every day.  If you want to prevent pregnancy, talk to your health care provider about birth control (contraception). OSTEOPOROSIS AND MENOPAUSE   Osteoporosis is a disease in which the bones lose minerals and strength with aging. This can result in serious bone  fractures. Your risk for osteoporosis can be identified using a bone density scan.  If you are 8 years of age or older, or if you are at risk for osteoporosis and fractures, ask your health care provider if you should be screened.  Ask your health care provider whether you should take a calcium or vitamin D supplement to lower your risk for osteoporosis.  Menopause may have certain physical symptoms and risks.  Hormone replacement therapy may reduce some of these symptoms and risks. Talk to your health care provider about whether hormone replacement therapy is right for you.  HOME CARE INSTRUCTIONS   Schedule regular health, dental, and eye exams.  Stay current with your immunizations.   Do not use any tobacco products including cigarettes, chewing tobacco, or electronic cigarettes.  If you are pregnant, do not drink alcohol.  If you are breastfeeding, limit how much and how often you drink alcohol.  Limit alcohol intake to no more than 1 drink per day for nonpregnant women. One drink equals 12 ounces of beer, 5 ounces of wine, or 1 ounces of hard liquor.  Do not use street drugs.  Do not share needles.  Ask your health care provider for help if you need support or information about quitting drugs.  Tell your health care provider if you often feel depressed.  Tell your health care provider if you have ever been abused or do not feel safe at home.   This information is not intended to replace advice given to you by your health care provider. Make sure you discuss any questions you have with your health care provider.   Document Released: 02/26/2011 Document Revised: 09/03/2014 Document Reviewed: 07/15/2013 Elsevier Interactive Patient Education 2016 [place] Your High Blood Pressure Blood pressure is a measurement of how forceful your blood is pressing against the walls of the arteries. Arteries are muscular tubes within the circulatory system. Blood pressure  does not stay the same. Blood pressure rises when you are active, excited, or nervous; and it lowers during sleep and relaxation. If the numbers measuring your  blood pressure stay above normal most of the time, you are at risk for health problems. High blood pressure (hypertension) is a long-term (chronic) condition in which blood pressure is elevated. A blood pressure reading is recorded as two numbers, such as 120 over 80 (or 120/80). The first, higher number is called the systolic pressure. It is a measure of the pressure in your arteries as the heart beats. The second, lower number is called the diastolic pressure. It is a measure of the pressure in your arteries as the heart relaxes between beats.  Keeping your blood pressure in a normal range is important to your overall health and prevention of health problems, such as heart disease and stroke. When your blood pressure is uncontrolled, your heart has to work harder than normal. High blood pressure is a very common condition in adults because blood pressure tends to rise with age. Men and women are equally likely to have hypertension but at different times in life. Before age 55, men are more likely to have hypertension. After 31 years of age, women are more likely to have it. Hypertension is especially common in African Americans. This condition often has no signs or symptoms. The cause of the condition is usually not known. Your caregiver can help you come up with a plan to keep your blood pressure in a normal, healthy range. BLOOD PRESSURE STAGES Blood pressure is classified into four stages: normal, prehypertension, stage 1, and stage 2. Your blood pressure reading will be used to determine what type of treatment, if any, is necessary. Appropriate treatment options are tied to these four stages:  Normal  Systolic pressure (mm Hg): below 120.  Diastolic pressure (mm Hg): below 80. Prehypertension  Systolic pressure (mm Hg): 120 to  139.  Diastolic pressure (mm Hg): 80 to 89. Stage1  Systolic pressure (mm Hg): 140 to 159.  Diastolic pressure (mm Hg): 90 to 99. Stage2  Systolic pressure (mm Hg): 160 or above.  Diastolic pressure (mm Hg): 100 or above. RISKS RELATED TO HIGH BLOOD PRESSURE Managing your blood pressure is an important responsibility. Uncontrolled high blood pressure can lead to:  A heart attack.  A stroke.  A weakened blood vessel (aneurysm).  Heart failure.  Kidney damage.  Eye damage.  Metabolic syndrome.  Memory and concentration problems. HOW TO MANAGE YOUR BLOOD PRESSURE Blood pressure can be managed effectively with lifestyle changes and medicines (if needed). Your caregiver will help you come up with a plan to bring your blood pressure within a normal range. Your plan should include the following: Education  Read all information provided by your caregivers about how to control blood pressure.  Educate yourself on the latest guidelines and treatment recommendations. New research is always being done to further define the risks and treatments for high blood pressure. Lifestylechanges  Control your weight.  Avoid smoking.  Stay physically active.  Reduce the amount of salt in your diet.  Reduce stress.  Control any chronic conditions, such as high cholesterol or diabetes.  Reduce your alcohol intake. Medicines  Several medicines (antihypertensive medicines) are available, if needed, to bring blood pressure within a normal range. Communication  Review all the medicines you take with your caregiver because there may be side effects or interactions.  Talk with your caregiver about your diet, exercise habits, and other lifestyle factors that may be contributing to high blood pressure.  See your caregiver regularly. Your caregiver can help you create and adjust your plan for managing high blood  pressure. RECOMMENDATIONS FOR TREATMENT AND FOLLOW-UP  The following  recommendations are based on current guidelines for managing high blood pressure in nonpregnant adults. Use these recommendations to identify the proper follow-up period or treatment option based on your blood pressure reading. You can discuss these options with your caregiver.  Systolic pressure of 409 to 735 or diastolic pressure of 80 to 89: Follow up with your caregiver as directed.  Systolic pressure of 329 to 924 or diastolic pressure of 90 to 100: Follow up with your caregiver within 2 months.  Systolic pressure above 268 or diastolic pressure above 341: Follow up with your caregiver within 1 month.  Systolic pressure above 962 or diastolic pressure above 229: Consider antihypertensive therapy; follow up with your caregiver within 1 week.  Systolic pressure above 798 or diastolic pressure above 921: Begin antihypertensive therapy; follow up with your caregiver within 1 week.   This information is not intended to replace advice given to you by your health care provider. Make sure you discuss any questions you have with your health care provider.   Document Released: 05/07/2012 Document Reviewed: 05/07/2012 Elsevier Interactive Patient Education 2016 [place] Your High Blood Pressure Blood pressure is a measurement of how forceful your blood is pressing against the walls of the arteries. Arteries are muscular tubes within the circulatory system. Blood pressure does not stay the same. Blood pressure rises when you are active, excited, or nervous; and it lowers during sleep and relaxation. If the numbers measuring your blood pressure stay above normal most of the time, you are at risk for health problems. High blood pressure (hypertension) is a long-term (chronic) condition in which blood pressure is elevated. A blood pressure reading is recorded as two numbers, such as 120 over 80 (or 120/80). The first, higher number is called the systolic pressure. It is a measure of the  pressure in your arteries as the heart beats. The second, lower number is called the diastolic pressure. It is a measure of the pressure in your arteries as the heart relaxes between beats.  Keeping your blood pressure in a normal range is important to your overall health and prevention of health problems, such as heart disease and stroke. When your blood pressure is uncontrolled, your heart has to work harder than normal. High blood pressure is a very common condition in adults because blood pressure tends to rise with age. Men and women are equally likely to have hypertension but at different times in life. Before age 38, men are more likely to have hypertension. After 31 years of age, women are more likely to have it. Hypertension is especially common in African Americans. This condition often has no signs or symptoms. The cause of the condition is usually not known. Your caregiver can help you come up with a plan to keep your blood pressure in a normal, healthy range. BLOOD PRESSURE STAGES Blood pressure is classified into four stages: normal, prehypertension, stage 1, and stage 2. Your blood pressure reading will be used to determine what type of treatment, if any, is necessary. Appropriate treatment options are tied to these four stages:  Normal  Systolic pressure (mm Hg): below 120.  Diastolic pressure (mm Hg): below 80. Prehypertension  Systolic pressure (mm Hg): 120 to 139.  Diastolic pressure (mm Hg): 80 to 89. Stage1  Systolic pressure (mm Hg): 140 to 159.  Diastolic pressure (mm Hg): 90 to 99. Stage2  Systolic pressure (mm Hg): 160 or above.  Diastolic pressure (  mm Hg): 100 or above. RISKS RELATED TO HIGH BLOOD PRESSURE Managing your blood pressure is an important responsibility. Uncontrolled high blood pressure can lead to:  A heart attack.  A stroke.  A weakened blood vessel (aneurysm).  Heart failure.  Kidney damage.  Eye damage.  Metabolic syndrome.  Memory  and concentration problems. HOW TO MANAGE YOUR BLOOD PRESSURE Blood pressure can be managed effectively with lifestyle changes and medicines (if needed). Your caregiver will help you come up with a plan to bring your blood pressure within a normal range. Your plan should include the following: Education  Read all information provided by your caregivers about how to control blood pressure.  Educate yourself on the latest guidelines and treatment recommendations. New research is always being done to further define the risks and treatments for high blood pressure. Lifestylechanges  Control your weight.  Avoid smoking.  Stay physically active.  Reduce the amount of salt in your diet.  Reduce stress.  Control any chronic conditions, such as high cholesterol or diabetes.  Reduce your alcohol intake. Medicines  Several medicines (antihypertensive medicines) are available, if needed, to bring blood pressure within a normal range. Communication  Review all the medicines you take with your caregiver because there may be side effects or interactions.  Talk with your caregiver about your diet, exercise habits, and other lifestyle factors that may be contributing to high blood pressure.  See your caregiver regularly. Your caregiver can help you create and adjust your plan for managing high blood pressure. RECOMMENDATIONS FOR TREATMENT AND FOLLOW-UP  The following recommendations are based on current guidelines for managing high blood pressure in nonpregnant adults. Use these recommendations to identify the proper follow-up period or treatment option based on your blood pressure reading. You can discuss these options with your caregiver.  Systolic pressure of 153 to 794 or diastolic pressure of 80 to 89: Follow up with your caregiver as directed.  Systolic pressure of 327 to 614 or diastolic pressure of 90 to 100: Follow up with your caregiver within 2 months.  Systolic pressure above 709  or diastolic pressure above 295: Follow up with your caregiver within 1 month.  Systolic pressure above 747 or diastolic pressure above 340: Consider antihypertensive therapy; follow up with your caregiver within 1 week.  Systolic pressure above 370 or diastolic pressure above 964: Begin antihypertensive therapy; follow up with your caregiver within 1 week.   This information is not intended to replace advice given to you by your health care provider. Make sure you discuss any questions you have with your health care provider.   Document Released: 05/07/2012 Document Reviewed: 05/07/2012 Elsevier Interactive Patient Education [employer].

## 2015-07-18 LAB — URINE CULTURE

## 2015-07-20 ENCOUNTER — Telehealth: Payer: Self-pay

## 2015-07-20 NOTE — Telephone Encounter (Signed)
Please alert that her urine was relatively normal.  Showed lactobacillus which is common flora.  Please ask what her symptoms are (urinary frequency, blood in urine, pain with urine, vaginal discharge, etc.).  She should continue appropriate hydration.   

## 2015-07-20 NOTE — Telephone Encounter (Signed)
Pt is leaving to go OOT. Will stop at an urgent care at her destination

## 2015-07-20 NOTE — Telephone Encounter (Signed)
Spoke with pt. Notified of results. Pt is having vaginal discharge and itching. Says it feels like she has BV

## 2015-07-20 NOTE — Telephone Encounter (Signed)
Please alert that her urine was relatively normal.  Showed lactobacillus which is common flora.  Please ask what her symptoms are (urinary frequency, blood in urine, pain with urine, vaginal discharge, etc.).  She should continue appropriate hydration.

## 2015-07-20 NOTE — Telephone Encounter (Signed)
Pt called about urine culture. Still having urinary symptoms. Wants to know if we can send in an abx. Can someone review for Dr. Clelia CroftShaw? Thanks

## 2015-07-20 NOTE — Telephone Encounter (Signed)
Patient is calling back to about her labs. She states she will be going out of town when she get off work today

## 2015-07-20 NOTE — Telephone Encounter (Signed)
Please contact patient and advise that she should return.  Her sxs were not consistent with this finding, at her visit.

## 2016-04-24 ENCOUNTER — Other Ambulatory Visit: Payer: Self-pay | Admitting: *Deleted

## 2016-04-24 ENCOUNTER — Telehealth: Payer: Self-pay | Admitting: Certified Nurse Midwife

## 2016-04-24 DIAGNOSIS — Z113 Encounter for screening for infections with a predominantly sexual mode of transmission: Secondary | ICD-10-CM

## 2016-04-24 DIAGNOSIS — B9689 Other specified bacterial agents as the cause of diseases classified elsewhere: Secondary | ICD-10-CM

## 2016-04-24 DIAGNOSIS — B3731 Acute candidiasis of vulva and vagina: Secondary | ICD-10-CM

## 2016-04-24 DIAGNOSIS — Z800 Family history of malignant neoplasm of digestive organs: Secondary | ICD-10-CM

## 2016-04-24 DIAGNOSIS — Z01419 Encounter for gynecological examination (general) (routine) without abnormal findings: Secondary | ICD-10-CM

## 2016-04-24 DIAGNOSIS — N760 Acute vaginitis: Secondary | ICD-10-CM

## 2016-04-24 DIAGNOSIS — B373 Candidiasis of vulva and vagina: Secondary | ICD-10-CM

## 2016-04-24 MED ORDER — ETONOGESTREL-ETHINYL ESTRADIOL 0.12-0.015 MG/24HR VA RING: 1.0000 | VAGINAL_RING | VAGINAL | 0 refills | Status: DC

## 2016-04-24 NOTE — Telephone Encounter (Signed)
Pt called stating that she left a vm with the nurse requesting a birth control refill for one more month. She has moved to [place] and is in the process of transferring care, but requested a refill until she gets an appt. Pt uses the [facility] on [address]. Last annual was in Aug of 2016. Please advise.

## 2016-04-24 NOTE — Telephone Encounter (Signed)
Rx sent to patient pharmacy.

## 2016-10-05 IMAGING — US US OB COMP LESS 14 WK
1 series · 14 of 28 positions shown · non-contrast
Comparison: None.

CLINICAL DATA: Patient with vaginal bleeding.

EXAM:
OBSTETRIC <14 WK US AND TRANSVAGINAL OB US
TECHNIQUE: Both transabdominal and transvaginal ultrasound examinations were
performed for complete evaluation of the gestation as well as the
maternal uterus, adnexal regions, and pelvic cul-de-sac.
Transvaginal technique was performed to assess early pregnancy.

[Series 1: us ob comp less 14 wk · 0.22mm/px · 53 acquisitions, 14 frames shown]
[im 2/53]
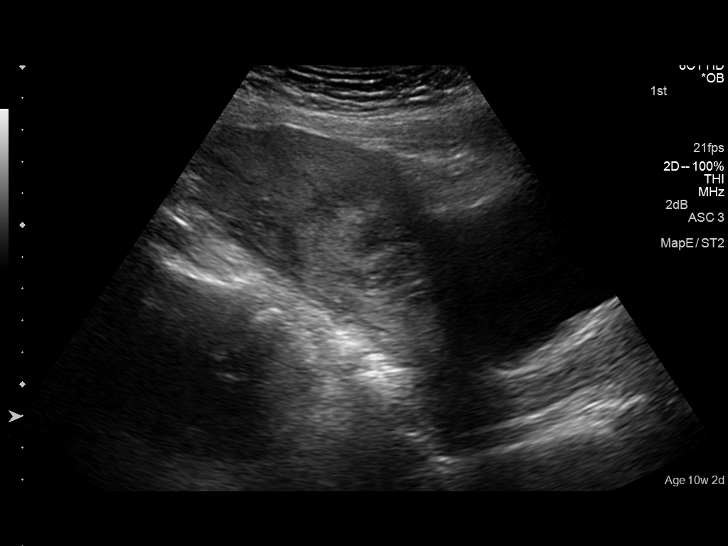
[im 6/53]
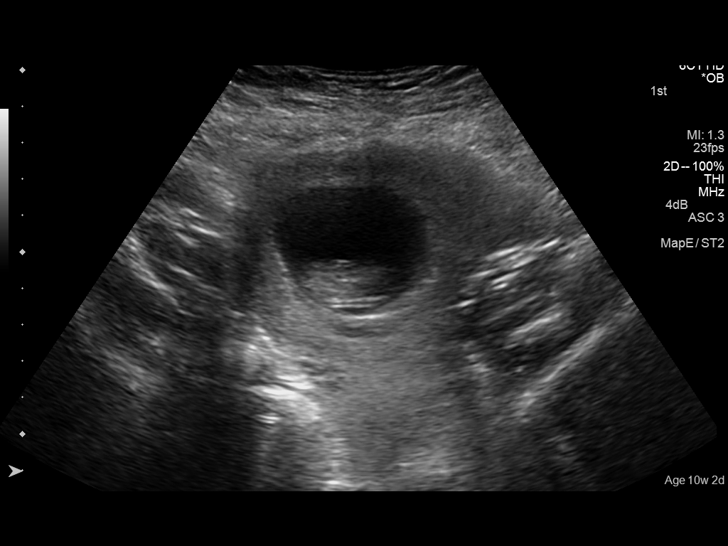
[im 10/53]
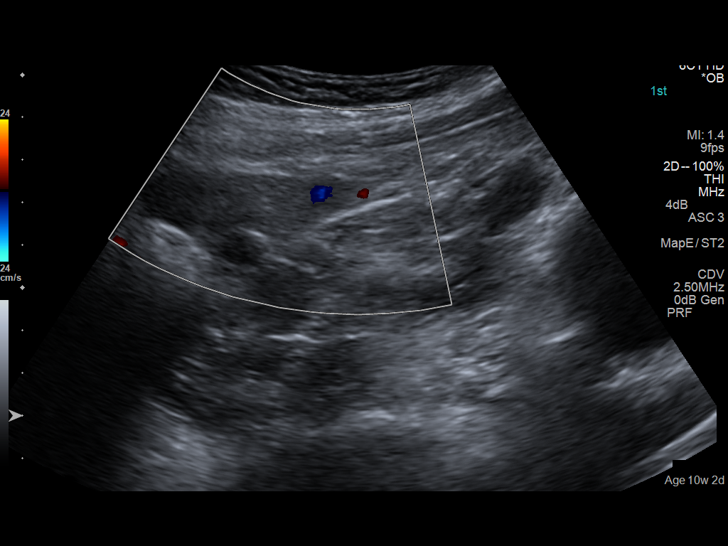
[im 14/53]
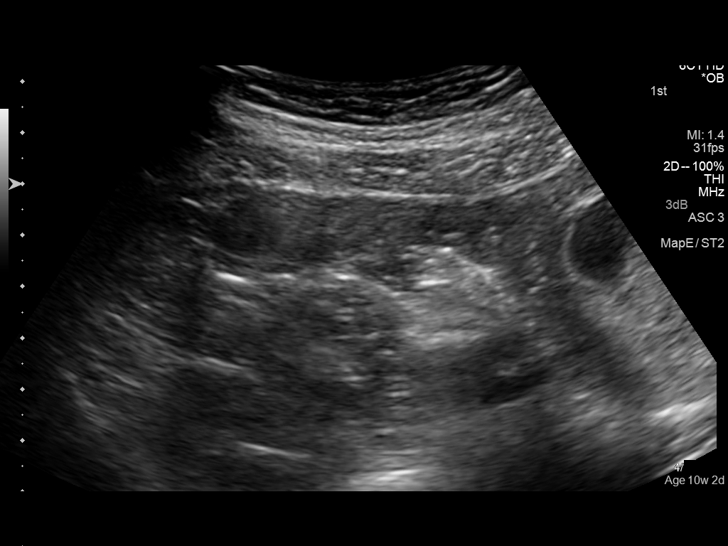
[im 18/53]
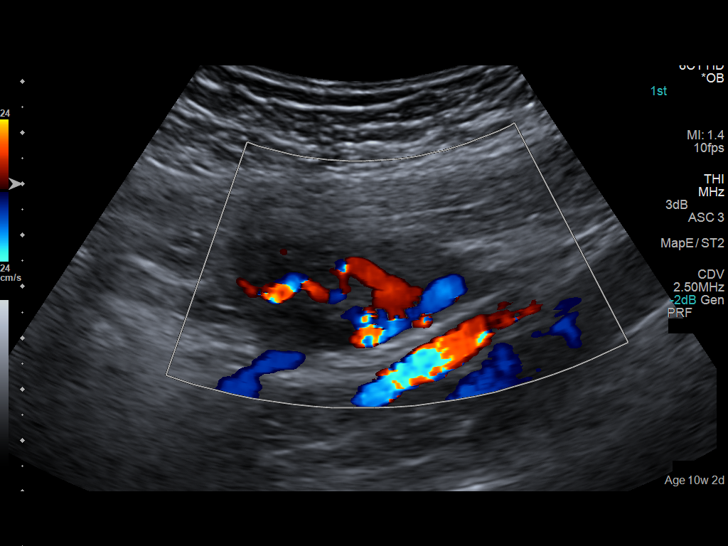
[im 22/53]
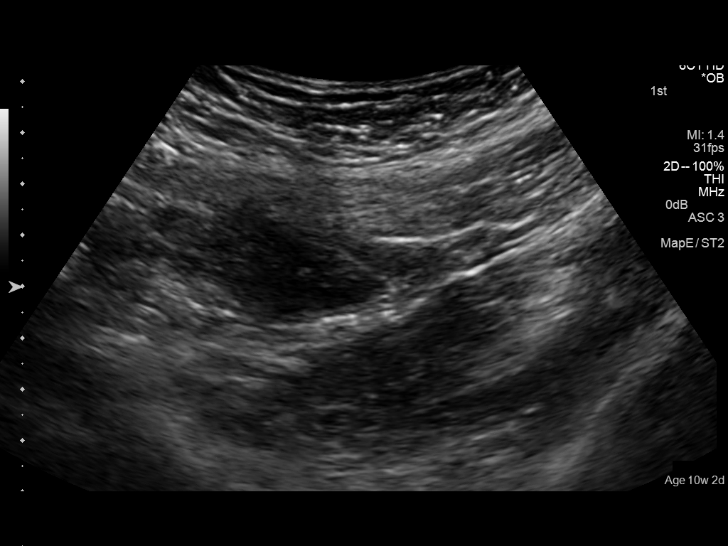
[im 26/53]
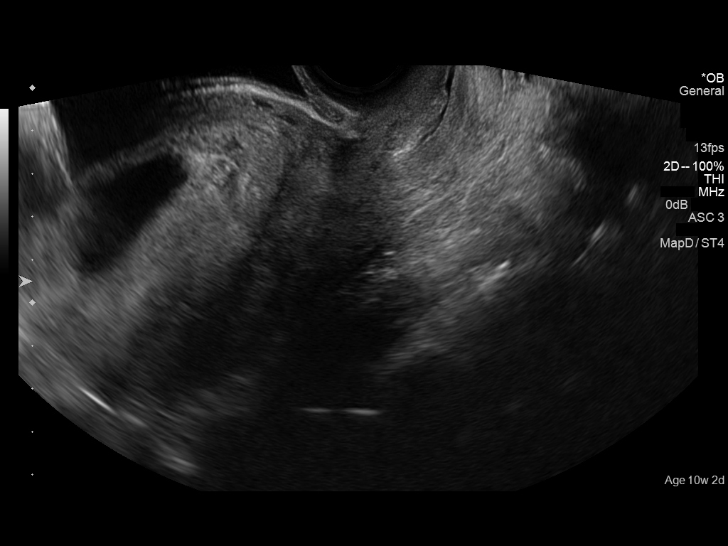
[im 29/53]
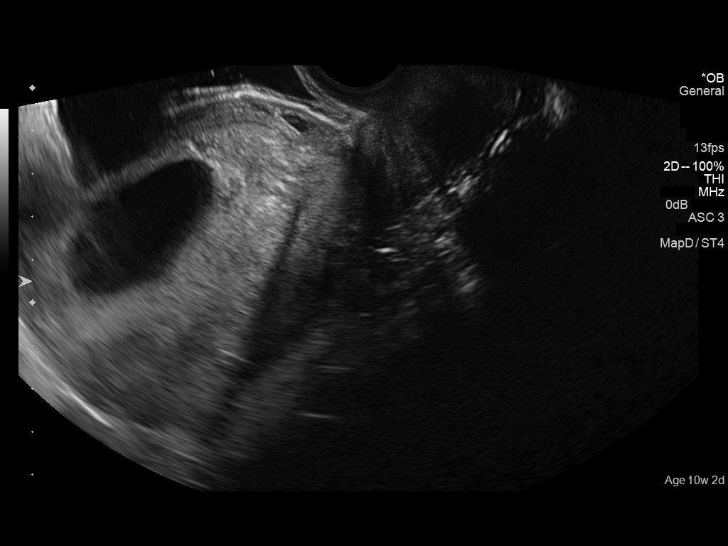
[im 33/53]
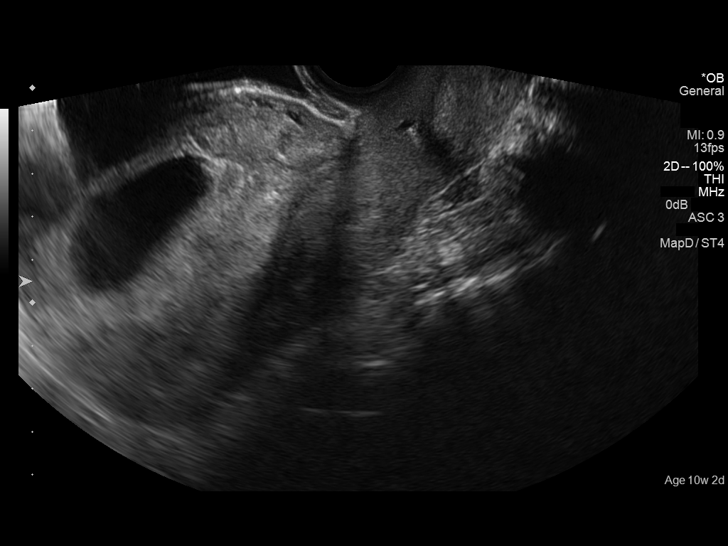
[im 37/53]
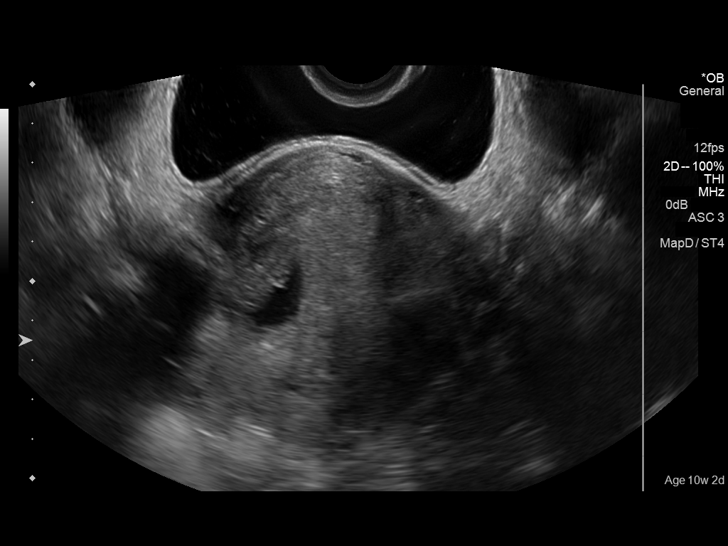
[im 41/53]
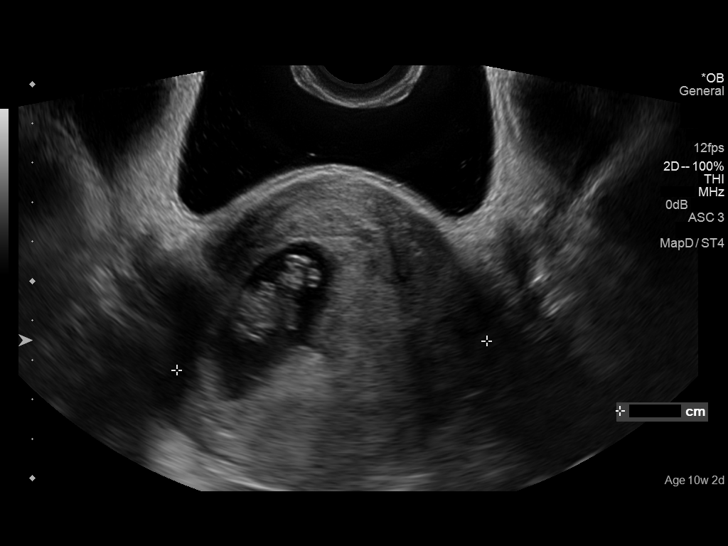
[im 45/53]
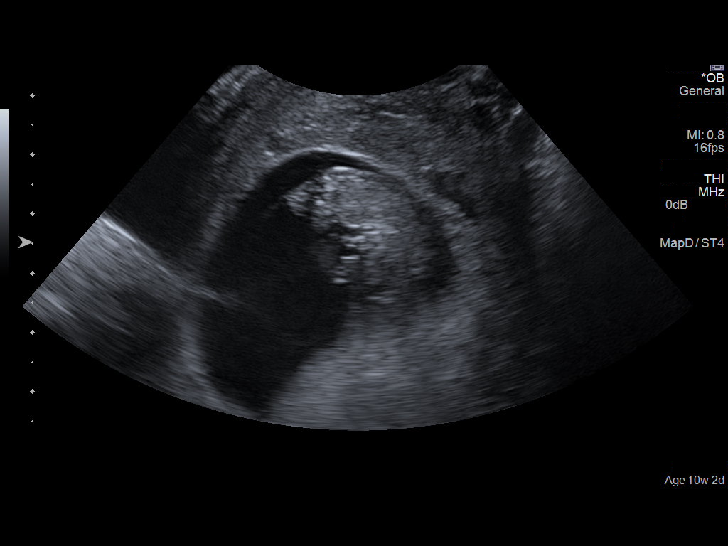
[im 49/53]
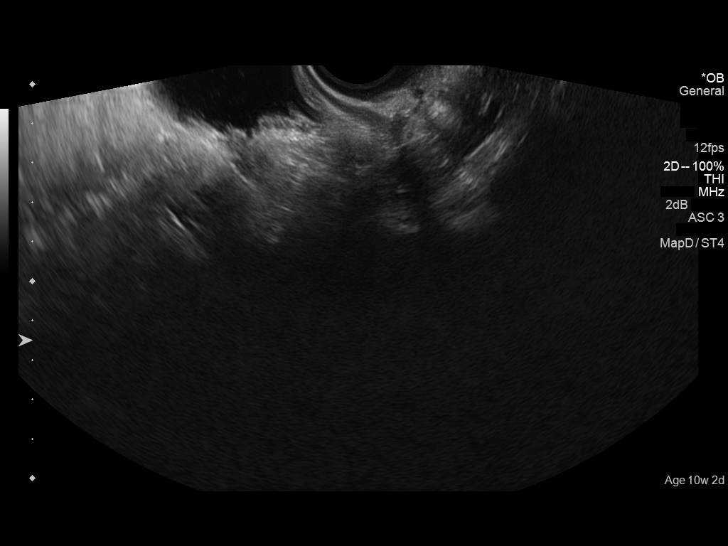
[im 53/53]
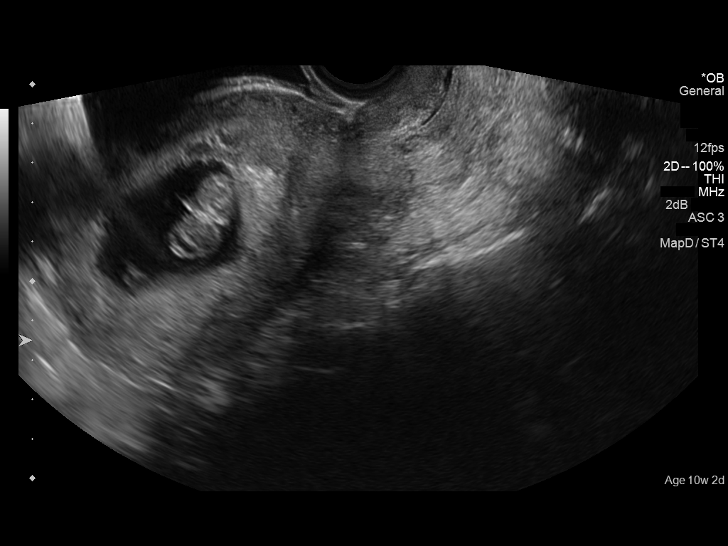

[14 of 28 positions shown; findings below may reference images not displayed]

FINDINGS: Intrauterine gestational sac: Visualized/normal in shape.

Yolk sac:  Present

Embryo:  Present

Cardiac Activity: Present

Heart Rate: 178  bpm

CRL:  26  mm   9 w   2 d                  US EDC: 09/20/2015

Maternal uterus/adnexae: No subchorionic hemorrhage. The right and
left ovaries are unremarkable. No free fluid in the pelvis.
IMPRESSION: Single live intrauterine gestation 9 weeks 2 days by crown-rump
length.

## 2016-10-26 ENCOUNTER — Other Ambulatory Visit: Payer: Self-pay | Admitting: Certified Nurse Midwife

## 2016-10-26 DIAGNOSIS — B373 Candidiasis of vulva and vagina: Secondary | ICD-10-CM

## 2016-10-26 DIAGNOSIS — Z113 Encounter for screening for infections with a predominantly sexual mode of transmission: Secondary | ICD-10-CM

## 2016-10-26 DIAGNOSIS — N760 Acute vaginitis: Secondary | ICD-10-CM

## 2016-10-26 DIAGNOSIS — B9689 Other specified bacterial agents as the cause of diseases classified elsewhere: Secondary | ICD-10-CM

## 2016-10-26 DIAGNOSIS — Z01419 Encounter for gynecological examination (general) (routine) without abnormal findings: Secondary | ICD-10-CM

## 2016-10-26 DIAGNOSIS — B3731 Acute candidiasis of vulva and vagina: Secondary | ICD-10-CM

## 2016-10-26 DIAGNOSIS — Z800 Family history of malignant neoplasm of digestive organs: Secondary | ICD-10-CM

## 2019-11-05 ENCOUNTER — Ambulatory Visit: Payer: BLUE CROSS/BLUE SHIELD
